# Patient Record
Sex: Female | Born: 1955 | Race: White | Hispanic: No | Marital: Married | Smoking: Never smoker
Health system: Southern US, Community
[De-identification: ages and names within clinical notes are randomized; demographics above are authoritative.]

## PROBLEM LIST (undated history)

## (undated) DIAGNOSIS — E119 Type 2 diabetes mellitus without complications: Secondary | ICD-10-CM

## (undated) DIAGNOSIS — R51 Headache: Secondary | ICD-10-CM

## (undated) DIAGNOSIS — M1990 Unspecified osteoarthritis, unspecified site: Secondary | ICD-10-CM

## (undated) DIAGNOSIS — N390 Urinary tract infection, site not specified: Secondary | ICD-10-CM

## (undated) DIAGNOSIS — K219 Gastro-esophageal reflux disease without esophagitis: Secondary | ICD-10-CM

## (undated) DIAGNOSIS — E785 Hyperlipidemia, unspecified: Secondary | ICD-10-CM

## (undated) DIAGNOSIS — Z889 Allergy status to unspecified drugs, medicaments and biological substances status: Secondary | ICD-10-CM

## (undated) DIAGNOSIS — G43909 Migraine, unspecified, not intractable, without status migrainosus: Secondary | ICD-10-CM

## (undated) DIAGNOSIS — K5792 Diverticulitis of intestine, part unspecified, without perforation or abscess without bleeding: Secondary | ICD-10-CM

## (undated) DIAGNOSIS — D7218 Eosinophilia in diseases classified elsewhere: Secondary | ICD-10-CM

## (undated) DIAGNOSIS — T7840XA Allergy, unspecified, initial encounter: Secondary | ICD-10-CM

## (undated) DIAGNOSIS — B019 Varicella without complication: Secondary | ICD-10-CM

## (undated) DIAGNOSIS — M354 Diffuse (eosinophilic) fasciitis: Secondary | ICD-10-CM

## (undated) DIAGNOSIS — E039 Hypothyroidism, unspecified: Secondary | ICD-10-CM

## (undated) DIAGNOSIS — R519 Headache, unspecified: Secondary | ICD-10-CM

## (undated) DIAGNOSIS — E079 Disorder of thyroid, unspecified: Secondary | ICD-10-CM

## (undated) HISTORY — DX: Disorder of thyroid, unspecified: E07.9

## (undated) HISTORY — DX: Headache: R51

## (undated) HISTORY — PX: BACK SURGERY: SHX140

## (undated) HISTORY — PX: TONSILLECTOMY: SUR1361

## (undated) HISTORY — DX: Type 2 diabetes mellitus without complications: E11.9

## (undated) HISTORY — DX: Headache, unspecified: R51.9

## (undated) HISTORY — PX: ABDOMINAL SURGERY: SHX537

## (undated) HISTORY — DX: Diverticulitis of intestine, part unspecified, without perforation or abscess without bleeding: K57.92

## (undated) HISTORY — DX: Varicella without complication: B01.9

## (undated) HISTORY — DX: Urinary tract infection, site not specified: N39.0

## (undated) HISTORY — DX: Hyperlipidemia, unspecified: E78.5

## (undated) HISTORY — DX: Unspecified osteoarthritis, unspecified site: M19.90

## (undated) HISTORY — DX: Migraine, unspecified, not intractable, without status migrainosus: G43.909

## (undated) HISTORY — DX: Allergy, unspecified, initial encounter: T78.40XA

## (undated) HISTORY — PX: MOUTH SURGERY: SHX715

## (undated) HISTORY — DX: Gastro-esophageal reflux disease without esophagitis: K21.9

---

## 1999-10-07 HISTORY — PX: ABDOMINAL HYSTERECTOMY: SHX81

## 2009-01-01 DIAGNOSIS — E782 Mixed hyperlipidemia: Secondary | ICD-10-CM | POA: Insufficient documentation

## 2009-01-01 DIAGNOSIS — E119 Type 2 diabetes mellitus without complications: Secondary | ICD-10-CM | POA: Insufficient documentation

## 2009-01-01 DIAGNOSIS — K219 Gastro-esophageal reflux disease without esophagitis: Secondary | ICD-10-CM | POA: Insufficient documentation

## 2009-01-01 DIAGNOSIS — K5732 Diverticulitis of large intestine without perforation or abscess without bleeding: Secondary | ICD-10-CM | POA: Insufficient documentation

## 2009-01-01 DIAGNOSIS — E039 Hypothyroidism, unspecified: Secondary | ICD-10-CM | POA: Insufficient documentation

## 2009-01-01 DIAGNOSIS — F329 Major depressive disorder, single episode, unspecified: Secondary | ICD-10-CM | POA: Insufficient documentation

## 2009-01-01 DIAGNOSIS — L500 Allergic urticaria: Secondary | ICD-10-CM | POA: Insufficient documentation

## 2015-09-12 ENCOUNTER — Encounter: Payer: Self-pay | Admitting: Nurse Practitioner

## 2015-11-10 ENCOUNTER — Emergency Department (HOSPITAL_BASED_OUTPATIENT_CLINIC_OR_DEPARTMENT_OTHER): Payer: No Typology Code available for payment source

## 2015-11-10 ENCOUNTER — Emergency Department (HOSPITAL_BASED_OUTPATIENT_CLINIC_OR_DEPARTMENT_OTHER)
Admission: EM | Admit: 2015-11-10 | Discharge: 2015-11-10 | Disposition: A | Discharge status: Home / Self Care | Payer: No Typology Code available for payment source | Attending: Physician Assistant | Admitting: Physician Assistant

## 2015-11-10 ENCOUNTER — Encounter (HOSPITAL_BASED_OUTPATIENT_CLINIC_OR_DEPARTMENT_OTHER): Payer: Self-pay | Admitting: *Deleted

## 2015-11-10 DIAGNOSIS — W228XXA Striking against or struck by other objects, initial encounter: Secondary | ICD-10-CM | POA: Diagnosis not present

## 2015-11-10 DIAGNOSIS — Z8739 Personal history of other diseases of the musculoskeletal system and connective tissue: Secondary | ICD-10-CM | POA: Insufficient documentation

## 2015-11-10 DIAGNOSIS — Y9389 Activity, other specified: Secondary | ICD-10-CM | POA: Diagnosis not present

## 2015-11-10 DIAGNOSIS — Y998 Other external cause status: Secondary | ICD-10-CM | POA: Insufficient documentation

## 2015-11-10 DIAGNOSIS — Z8639 Personal history of other endocrine, nutritional and metabolic disease: Secondary | ICD-10-CM | POA: Insufficient documentation

## 2015-11-10 DIAGNOSIS — S6992XA Unspecified injury of left wrist, hand and finger(s), initial encounter: Secondary | ICD-10-CM | POA: Diagnosis present

## 2015-11-10 DIAGNOSIS — S60222A Contusion of left hand, initial encounter: Secondary | ICD-10-CM | POA: Diagnosis not present

## 2015-11-10 DIAGNOSIS — Y9289 Other specified places as the place of occurrence of the external cause: Secondary | ICD-10-CM | POA: Diagnosis not present

## 2015-11-10 HISTORY — DX: Allergy status to unspecified drugs, medicaments and biological substances: Z88.9

## 2015-11-10 HISTORY — DX: Eosinophilia in diseases classified elsewhere: D72.18

## 2015-11-10 HISTORY — DX: Hypothyroidism, unspecified: E03.9

## 2015-11-10 HISTORY — DX: Diffuse (eosinophilic) fasciitis: M35.4

## 2015-11-10 NOTE — ED Provider Notes (Signed)
CSN: [id number]     Arrival date & time 11/10/15  1958 History   First MD Initiated Contact with Patient 11/10/15 2020     Chief Complaint  Patient presents with  . Hand Injury     (Consider location/radiation/quality/duration/timing/severity/associated sxs/prior Treatment) Patient is a 60 y.o. female presenting with hand injury. The history is provided by the patient.  Hand Injury Location:  Hand Injury: yes   Hand location:  L hand Pain details:    Quality:  Aching   Severity:  Moderate   Onset quality:  Sudden   Timing:  Constant   Progression:  Unchanged Chronicity:  New Dislocation: no   Foreign body present:  No foreign bodies Prior injury to area:  No  Jawana Reagor is a 60 y.o. female who presents to the ED wit left hand pain. She reports pulling at something and her hand slipped and hit the wooden bed frame. She denies any other injuries. She has had problems with this hand in the past and often has pain in her middle finger.   Past Medical History  Diagnosis Date  . Hypothyroid   . Multiple allergies   . Eosinophilic fasciitis    Past Surgical History  Procedure Laterality Date  . Back surgery    . Abdominal hysterectomy    . Abdominal surgery    . Mouth surgery    . Tonsillectomy     No family history on file. Social History  Substance Use Topics  . Smoking status: Never Smoker   . Smokeless tobacco: Never Used  . Alcohol Use: No   OB History    No data available     Review of Systems  Musculoskeletal: Positive for joint swelling.       Pain dorsum of left hand  all other systems negative    Allergies  Other  Home Medications   Prior to Admission medications   Not on File   BP 141/85 mmHg  Pulse 99  Temp(Src) 98.4 F (36.9 C) (Oral)  Resp 18  Ht  (1.626 m)  Wt 82.555 kg  BMI 31.22 kg/m2  SpO2 99% Physical Exam  Constitutional: She is oriented to person, place, and time. She appears well-developed and well-nourished.  HENT:    Head: Normocephalic and atraumatic.  Eyes: Conjunctivae and EOM are normal.  Neck: Neck supple.  Cardiovascular: Normal rate.   Pulmonary/Chest: Effort normal.  Musculoskeletal:       Left hand: She exhibits tenderness, bony tenderness and swelling. She exhibits normal capillary refill and no laceration. Decreased range of motion: due to pain. Normal sensation noted. Normal strength noted. She exhibits no thumb/finger opposition.       Hands: Ecchymosis and tenderness to the dorsum of the left middle finger and hand. Radial pulses 2+, adequate circulation, good touch sensation.  Neurological: She is alert and oriented to person, place, and time. No cranial nerve deficit.  Skin: Skin is warm and dry.  Psychiatric: She has a normal mood and affect. Her behavior is normal.  Nursing note and vitals reviewed.   ED Course  Procedures (including critical care time) Labs Review Labs Reviewed - No data to display  Imaging Review Dg Hand Complete Left  11/10/2015  CLINICAL DATA:  Left hand pain after injury. Struck hand on a bed frame prior to arrival. EXAM: LEFT HAND - COMPLETE 3+ VIEW COMPARISON:  None. FINDINGS: No fracture or dislocation. The alignment and joint spaces are maintained. Carpometacarpal joints are intact. No focal  soft tissue abnormality. IMPRESSION: No fracture or dislocation of the left hand. Electronically Signed   By: Rubye Oaks M.D.   On: 11/10/2015 20:25   I have personally reviewed and evaluated these images as part of my medical decision-making. Splint to right middle finger.   MDM  60 y.o. female with pain to the dorsum of the left hand s/p injury stable for d/c without focal neuro deficits. Will d/c home and she will apply ice, elevate, take OTC pain deliver and follow up with her PCP if symptoms persist.    Final diagnoses:  Contusion, hand, left, initial encounter       [facility], NP 11/10/15 2222  Courteney Lyn Mackuen, MD 11/10/15 2318

## 2015-11-10 NOTE — Discharge Instructions (Signed)
Take Advil as needed for pain. Follow up with Dr. Pearletha Forge if symptoms persist. Return here as needed.

## 2015-11-10 NOTE — ED Notes (Signed)
Pt reports she hit left hand on wooden bed frame

## 2015-11-14 ENCOUNTER — Encounter: Payer: Self-pay | Admitting: Nurse Practitioner

## 2015-11-20 DIAGNOSIS — Z6830 Body mass index (BMI) 30.0-30.9, adult: Secondary | ICD-10-CM | POA: Insufficient documentation

## 2016-01-22 DIAGNOSIS — M2550 Pain in unspecified joint: Secondary | ICD-10-CM | POA: Insufficient documentation

## 2016-06-30 ENCOUNTER — Emergency Department (HOSPITAL_BASED_OUTPATIENT_CLINIC_OR_DEPARTMENT_OTHER)
Admission: EM | Admit: 2016-06-30 | Discharge: 2016-06-30 | Disposition: A | Discharge status: Home / Self Care | Payer: No Typology Code available for payment source | Attending: Emergency Medicine | Admitting: Emergency Medicine

## 2016-06-30 ENCOUNTER — Encounter (HOSPITAL_BASED_OUTPATIENT_CLINIC_OR_DEPARTMENT_OTHER): Payer: Self-pay | Admitting: *Deleted

## 2016-06-30 DIAGNOSIS — E039 Hypothyroidism, unspecified: Secondary | ICD-10-CM | POA: Diagnosis not present

## 2016-06-30 DIAGNOSIS — Z79899 Other long term (current) drug therapy: Secondary | ICD-10-CM | POA: Diagnosis not present

## 2016-06-30 DIAGNOSIS — L03115 Cellulitis of right lower limb: Secondary | ICD-10-CM | POA: Insufficient documentation

## 2016-06-30 DIAGNOSIS — M25571 Pain in right ankle and joints of right foot: Secondary | ICD-10-CM | POA: Diagnosis present

## 2016-06-30 MED ORDER — DOXYCYCLINE HYCLATE 100 MG PO CAPS: 100.0000 mg | ORAL_CAPSULE | Freq: Two times a day (BID) | ORAL | 0 refills | Status: DC

## 2016-06-30 MED ORDER — CEPHALEXIN 250 MG PO CAPS
500.0000 mg | ORAL_CAPSULE | Freq: Once | ORAL | Status: DC
  Filled 2016-06-30: qty 2

## 2016-06-30 MED ORDER — DOXYCYCLINE HYCLATE 100 MG PO TABS
100.0000 mg | ORAL_TABLET | Freq: Once | ORAL | Status: DC
  Filled 2016-06-30: qty 1

## 2016-06-30 MED ORDER — CEPHALEXIN 500 MG PO CAPS: 500.0000 mg | ORAL_CAPSULE | Freq: Two times a day (BID) | ORAL | 0 refills | Status: DC

## 2016-06-30 NOTE — ED Triage Notes (Signed)
Pt reports right ankle pain with swelling and warmth x 6 days.

## 2016-06-30 NOTE — ED Provider Notes (Addendum)
[facility]   CSN: [id number] Arrival date & time: 06/30/16  1900 By signing my name below, I, Bridgette Habermann, attest that this documentation has been prepared under the direction and in the presence of Tomasita Crumble, MD. Electronically Signed: Bridgette Habermann, ED Scribe. 06/30/16. 9:28 PM.  History   Chief Complaint Chief Complaint  Patient presents with  . Ankle Pain   HPI Comments: Monica Dean is a 60 y.o. female with h/o tendonitis who presents to the Emergency Department complaining of  10/10 right ankle pain with swelling and warmth onset 6 days ago with associated fever (Tmax 99.7). She denies any recent injury or trauma. Pain is exacerbated with bearing weight on the area. Pt uses a boot intermittently with moderate relief to the pain. No other treatments tried. Denies h/o surgery to the area. Pt states she is suppose to get an MRI done tomorrow. Pt denies numbness and paresthesias to the area.  The history is provided by the patient. No language interpreter was used.    Past Medical History:  Diagnosis Date  . Eosinophilic fasciitis   . Hypothyroid   . Multiple allergies     There are no active problems to display for this patient.   Past Surgical History:  Procedure Laterality Date  . ABDOMINAL HYSTERECTOMY    . ABDOMINAL SURGERY    . BACK SURGERY    . MOUTH SURGERY    . TONSILLECTOMY      OB History    No data available       Home Medications    Prior to Admission medications   Medication Sig Start Date End Date Taking? Authorizing Provider  fexofenadine (ALLEGRA) 30 MG tablet Take 30 mg by mouth 2 (two) times daily.   Yes Historical Provider, MD  thyroid (ARMOUR) 130 MG tablet Take 130 mg by mouth daily.   Yes Historical Provider, MD    Family History History reviewed. No pertinent family history.  Social History Social History  Substance Use Topics  . Smoking status: Never Smoker  . Smokeless tobacco: Never Used  . Alcohol use  No     Allergies   Other   Review of Systems Review of Systems 10 Systems reviewed and all are negative for acute change except as noted in the HPI. Physical Exam Updated Vital Signs BP (!) 135/109 (BP Location: Left Arm)   Pulse 111   Temp 99.7 F (37.6 C) (Oral)   Resp 18   Ht 5\' 5"  (1.651 m)   Wt 182 lb (82.6 kg)   SpO2 97%   BMI 30.29 kg/m   Physical Exam  Constitutional: She is oriented to person, place, and time. She appears well-developed and well-nourished. No distress.  HENT:  Head: Normocephalic and atraumatic.  Nose: Nose normal.  Mouth/Throat: Oropharynx is clear and moist. No oropharyngeal exudate.  Eyes: Conjunctivae and EOM are normal. Pupils are equal, round, and reactive to light. No scleral icterus.  Neck: Normal range of motion. Neck supple. No JVD present. No tracheal deviation present. No thyromegaly present.  Cardiovascular: Normal rate, regular rhythm and normal heart sounds.  Exam reveals no gallop and no friction rub.   No murmur heard. Pulmonary/Chest: Effort normal and breath sounds normal. No respiratory distress. She has no wheezes. She exhibits no tenderness.  Abdominal: Soft. Bowel sounds are normal. She exhibits no distension and no mass. There is no tenderness. There is no rebound and no guarding.  Musculoskeletal: Normal range of motion. She exhibits  tenderness. She exhibits no edema.  Left foot erythema and warmth to the left achilles' tendon. Tenderness to palpation but normal ROM. Normal pulses and sensation.  Lymphadenopathy:    She has no cervical adenopathy.  Neurological: She is alert and oriented to person, place, and time. No cranial nerve deficit. She exhibits normal muscle tone.  Skin: Skin is warm and dry. No rash noted. No erythema. No pallor.  Nursing note and vitals reviewed.   ED Treatments / Results  DIAGNOSTIC STUDIES: Oxygen Saturation is 97% on RA, adequate by my interpretation.    COORDINATION OF CARE: 9:28 PM  Discussed treatment plan with pt at bedside which includes Rx of antibiotics and pt agreed to plan.  Labs (all labs ordered are listed, but only abnormal results are displayed) Labs Reviewed - No data to display  EKG  EKG Interpretation None       Radiology No results found.  Procedures Procedures (including critical care time)  Medications Ordered in ED Medications - No data to display   Initial Impression / Assessment and Plan / ED Course  I have reviewed the triage vital signs and the nursing notes.  Pertinent labs & imaging results that were available during my care of the patient were reviewed by me and considered in my medical decision making (see chart for details).  Clinical Course    Patient presents to the ED for rash, pain and swelling in her ankle. PE is consistent with cellulitis. She was treated with keflex and doxycyline.  Plan to treat for 5 days and fu with PCP for wound check.    Final Clinical Impressions(s) / ED Diagnoses   Final diagnoses:  None    I personally performed the services described in this documentation, which was scribed in my presence. The recorded information has been reviewed and is accurate.    New Prescriptions New Prescriptions   No medications on file     Tomasita CrumbleAdeleke Roland Prine, MD 06/30/16 2137    Tomasita CrumbleAdeleke Pepper Kerrick, MD 06/30/16 2354

## 2016-06-30 NOTE — ED Notes (Signed)
C/o achilles tendon pain w slight swelling x 6 days  Hx of same

## 2016-08-06 LAB — POCT ERYTHROCYTE SEDIMENTATION RATE, NON-AUTOMATED: SED RATE: 10

## 2017-03-18 ENCOUNTER — Encounter: Payer: Self-pay | Admitting: Nurse Practitioner

## 2018-01-07 LAB — MICROALBUMIN, URINE: Microalb, Ur: 344

## 2018-01-09 LAB — LIPID PANEL
Cholesterol: 255 — AB (ref 0–200)
HDL: 31 — AB (ref 35–70)
LDL Cholesterol: 195
Triglycerides: 201 — AB (ref 40–160)

## 2018-01-09 LAB — BASIC METABOLIC PANEL
BUN: 13 (ref 4–21)
CREATININE: 0.9 (ref 0.5–1.1)
GLUCOSE: 215
Potassium: 4.3 (ref 3.4–5.3)
Sodium: 140 (ref 137–147)

## 2018-01-09 LAB — HEMOGLOBIN A1C: HEMOGLOBIN A1C: 8.7

## 2018-01-09 LAB — TSH: TSH: 5.9 (ref 0.41–5.90)

## 2018-01-09 LAB — HEPATIC FUNCTION PANEL: ALT: 26 (ref 7–35)

## 2018-01-12 ENCOUNTER — Emergency Department (HOSPITAL_BASED_OUTPATIENT_CLINIC_OR_DEPARTMENT_OTHER)
Admission: EM | Admit: 2018-01-12 | Discharge: 2018-01-12 | Disposition: A | Discharge status: Home / Self Care | Payer: No Typology Code available for payment source | Attending: Emergency Medicine | Admitting: Emergency Medicine

## 2018-01-12 ENCOUNTER — Other Ambulatory Visit: Payer: Self-pay

## 2018-01-12 ENCOUNTER — Encounter (HOSPITAL_BASED_OUTPATIENT_CLINIC_OR_DEPARTMENT_OTHER): Payer: Self-pay | Admitting: *Deleted

## 2018-01-12 ENCOUNTER — Emergency Department (HOSPITAL_BASED_OUTPATIENT_CLINIC_OR_DEPARTMENT_OTHER): Payer: No Typology Code available for payment source

## 2018-01-12 DIAGNOSIS — W108XXA Fall (on) (from) other stairs and steps, initial encounter: Secondary | ICD-10-CM | POA: Insufficient documentation

## 2018-01-12 DIAGNOSIS — Y9389 Activity, other specified: Secondary | ICD-10-CM | POA: Diagnosis not present

## 2018-01-12 DIAGNOSIS — Y9289 Other specified places as the place of occurrence of the external cause: Secondary | ICD-10-CM | POA: Diagnosis not present

## 2018-01-12 DIAGNOSIS — S60221A Contusion of right hand, initial encounter: Secondary | ICD-10-CM | POA: Diagnosis not present

## 2018-01-12 DIAGNOSIS — Y999 Unspecified external cause status: Secondary | ICD-10-CM | POA: Diagnosis not present

## 2018-01-12 DIAGNOSIS — E039 Hypothyroidism, unspecified: Secondary | ICD-10-CM | POA: Insufficient documentation

## 2018-01-12 DIAGNOSIS — S6991XA Unspecified injury of right wrist, hand and finger(s), initial encounter: Secondary | ICD-10-CM | POA: Diagnosis present

## 2018-01-12 DIAGNOSIS — Z79899 Other long term (current) drug therapy: Secondary | ICD-10-CM | POA: Diagnosis not present

## 2018-01-12 DIAGNOSIS — M79641 Pain in right hand: Secondary | ICD-10-CM

## 2018-01-12 NOTE — Discharge Instructions (Signed)
Wear ace wrap as needed for comfort, compression, and protection of the hand. Ice and elevate hand throughout the day, using ice pack for no more than 20 minutes every hour.  Alternate between tylenol and motrin for pain relief. Follow up with your orthopedist in 1-2 weeks for recheck of symptoms and ongoing management of your hand injury. Return to the ER for changes or worsening symptoms.

## 2018-01-12 NOTE — ED Triage Notes (Signed)
2 days ago she stumbled and caught herself from a fall hitting her right hand on the floor of her RV. Since then she can't use it to pick up objects. Painful.

## 2018-01-12 NOTE — ED Notes (Signed)
Ace wrap applied by RN Marchelle FolksAmanda

## 2018-01-12 NOTE — ED Provider Notes (Signed)
[facility] EMERGENCY DEPARTMENT Provider Note   CSN: [id number] Arrival date & time: 01/12/18  1132     History   Chief Complaint Chief Complaint  Patient presents with  . Fall  . Hand Injury    HPI Monica Dean is a 62 y.o. female with a PMHx of hypothyroidism, who presents to the ED with complaints of right hand pain after a mechanical fall that occurred 2 days ago.  Patient states that she was going up the steps of her RV when she stumbled and fell on her outstretched right hand striking the step of the RV with her hand.  Since then she has had 7/10 constant throbbing right hand pain that radiates into the forearm, worse with use or movement of the hand, and somewhat improved with ibuprofen and ice.  She reports associated swelling and bruising.  She denies hitting her head, LOC, wrist or elbow pain, numbness, tingling, focal weakness, or any other complaints at this time.  No other injury sustained during the incident.  She is right-handed.  She follows with [facility].  The history is provided by the patient and medical records. No language interpreter was used.  Fall   Hand Injury   The incident occurred 2 days ago. The incident occurred at home. The injury mechanism was a fall. The pain is present in the right hand. The quality of the pain is described as throbbing. The pain is at a severity of 7/10. The pain is moderate. The pain has been constant since the incident. She reports no foreign bodies present. The symptoms are aggravated by movement and use. She has tried ice and NSAIDs for the symptoms. The treatment provided mild relief.    Past Medical History:  Diagnosis Date  . Eosinophilic fasciitis   . Hypothyroid   . Multiple allergies     There are no active problems to display for this patient.   Past Surgical History:  Procedure Laterality Date  . ABDOMINAL HYSTERECTOMY    . ABDOMINAL SURGERY    . BACK SURGERY    . MOUTH SURGERY    .  TONSILLECTOMY       OB History   None      Home Medications    Prior to Admission medications   Medication Sig Start Date End Date Taking? Authorizing Provider  thyroid (ARMOUR) 130 MG tablet Take 130 mg by mouth daily.   Yes [provider]  cephALEXin (KEFLEX) 500 MG capsule Take 1 capsule (500 mg total) by mouth 2 (two) times daily. 06/30/16   Tomasita Crumble, MD  doxycycline (VIBRAMYCIN) 100 MG capsule Take 1 capsule (100 mg total) by mouth 2 (two) times daily. One po bid x 7 days 06/30/16   Tomasita Crumble, MD  fexofenadine (ALLEGRA) 30 MG tablet Take 30 mg by mouth 2 (two) times daily.    [provider]    Family History No family history on file.  Social History Social History   Tobacco Use  . Smoking status: Never Smoker  . Smokeless tobacco: Never Used  Substance Use Topics  . Alcohol use: No  . Drug use: No     Allergies   Other   Review of Systems Review of Systems  HENT: Negative for facial swelling (no head inj).   Musculoskeletal: Positive for arthralgias and joint swelling.  Skin: Positive for color change.  Allergic/Immunologic: Negative for immunocompromised state.  Neurological: Negative for syncope, weakness and numbness.  Psychiatric/Behavioral: Negative for confusion.  Physical Exam Updated Vital Signs BP (!) 142/82   Pulse 96   Temp 98.2 F (36.8 C) (Oral)   Resp 20   Ht 5\' 4"  (1.626 m)   Wt 81.6 kg (180 lb)   SpO2 98%   BMI 30.90 kg/m   Physical Exam  Constitutional: She is oriented to person, place, and time. Vital signs are normal. She appears well-developed and well-nourished.  Non-toxic appearance. No distress.  Afebrile, nontoxic, NAD  HENT:  Head: Normocephalic and atraumatic.  Mouth/Throat: Mucous membranes are normal.  Eyes: Conjunctivae and EOM are normal. Right eye exhibits no discharge. Left eye exhibits no discharge.  Neck: Normal range of motion. Neck supple.  Cardiovascular: Normal rate and intact  distal pulses.  Pulmonary/Chest: Effort normal. No respiratory distress.  Abdominal: Normal appearance. She exhibits no distension.  Musculoskeletal: Normal range of motion.       Right hand: She exhibits tenderness and bony tenderness. She exhibits normal range of motion, normal capillary refill, no deformity, no laceration and no swelling. Normal sensation noted. Normal strength noted.  R hand with FROM intact in all digits, and FROM intact at wrist/elbow, mild diffuse TTP across 2nd-5th mid-metacarpal regions, no TTP to wrist/forearm/elbow, no crepitus/deformity, minimal bruising near 2nd metacarpal region, no swelling, no wounds, Strength and sensation grossly intact, distal pulses intact, compartments soft.   Neurological: She is alert and oriented to person, place, and time. She has normal strength. No sensory deficit.  Skin: Skin is warm, dry and intact. No rash noted.  Psychiatric: She has a normal mood and affect. Her behavior is normal.  Nursing note and vitals reviewed.    ED Treatments / Results  Labs (all labs ordered are listed, but only abnormal results are displayed) Labs Reviewed - No data to display  EKG None  Radiology Dg Hand Complete Right  Result Date: 01/12/2018 CLINICAL DATA:  Fall, hand pain EXAM: RIGHT HAND - COMPLETE 3+ VIEW COMPARISON:  None. FINDINGS: No acute bony abnormality. Specifically, no fracture, subluxation, or dislocation. Joint spaces maintained. IMPRESSION: No acute bony abnormality. Electronically Signed   By: Charlett Nose M.D.   On: 01/12/2018 12:07    Procedures Procedures (including critical care time)  Medications Ordered in ED Medications - No data to display   Initial Impression / Assessment and Plan / ED Course  I have reviewed the triage vital signs and the nursing notes.  Pertinent labs & imaging results that were available during my care of the patient were reviewed by me and considered in my medical decision making (see chart  for details).     62 y.o. female here with R hand pain after mechanical fall 2 days ago. On exam, FROM intact in wrist and all fingers, mild TTP diffusely across the mid-metacarpal region of the 2nd-5th metacarpals, minimal bruising around the 2nd metacarpal area, no swelling, no crepitus or deformity. No tenderness to wrist/forearm/elbow. R hand xray negative for fx. Likely just contusion. Will apply ace wrap for comfort/compression/protection, advised RICE/tylenol/motrin for pain, and f/up with her orthopedist in 1-2wks for recheck. I explained the diagnosis and have given explicit precautions to return to the ER including for any other new or worsening symptoms. The patient understands and accepts the medical plan as it's been dictated and I have answered their questions. Discharge instructions concerning home care and prescriptions have been given. The patient is STABLE and is discharged to home in good condition.    Final Clinical Impressions(s) / ED Diagnoses   Final  diagnoses:  Right hand pain  Contusion of right hand, initial encounter    ED Discharge Orders    [address]None       Kamden Reber, [place], [place] 01/12/18 1257    Charlynne PanderYao, David Hsienta, MD 01/13/18 0700

## 2018-04-05 ENCOUNTER — Encounter: Payer: Self-pay | Admitting: Nurse Practitioner

## 2018-04-05 ENCOUNTER — Ambulatory Visit (INDEPENDENT_AMBULATORY_CARE_PROVIDER_SITE_OTHER): Payer: No Typology Code available for payment source | Admitting: Nurse Practitioner

## 2018-04-05 VITALS — BP 130/80 | HR 82 | Temp 98.7°F | Ht 64.5 in | Wt 177.0 lb

## 2018-04-05 DIAGNOSIS — L659 Nonscarring hair loss, unspecified: Secondary | ICD-10-CM

## 2018-04-05 DIAGNOSIS — R42 Dizziness and giddiness: Secondary | ICD-10-CM | POA: Diagnosis not present

## 2018-04-05 DIAGNOSIS — E039 Hypothyroidism, unspecified: Secondary | ICD-10-CM

## 2018-04-05 DIAGNOSIS — R030 Elevated blood-pressure reading, without diagnosis of hypertension: Secondary | ICD-10-CM

## 2018-04-05 NOTE — Patient Instructions (Addendum)
Labs ordered to be processed by [facility].  Entered referral to dermatology.  Uncontrolled hypothyroidism. [place]. Pending vitamin D. CBC, CMP and iron panel are normal. Ordered head CT to further evaluate dizziness  Repeat BP in office improved. Check BP at home once a day record. Bring BP reading to next OV.

## 2018-04-05 NOTE — Progress Notes (Signed)
Subjective:  Patient ID: Monica JohnsKathy Lank, female    DOB: 08/01/1956  Age: 62 y.o. MRN: [id number]  CC: Establish Care (est care/hair loss--1 mo /dizziness--2 days--tip of tongue and lip went numb last night--vomit)  Moved from [place] to [place] ago. Has pcp in [place] who she will like to maintain for now. He manages thyroid dysfunction  Dizziness  This is a new problem. The current episode started in the past 7 days. The problem occurs intermittently. The problem has been waxing and waning. Associated symptoms include nausea, numbness, vertigo and vomiting. Pertinent negatives include no chest pain, congestion, coughing, diaphoresis, fatigue, fever, headaches, neck pain, rash, sore throat, swollen glands, urinary symptoms, visual change or weakness. Nothing aggravates the symptoms. She has tried nothing for the symptoms.   Also reports hx of waxing and waning BP with provider in [place]. Does not check BP at home.  Also reports Hair loss since 01/2018, worsening over the last 1week. She brings along a small ziplock bag with hair. Uses hair dye every 6-8wks.  reports increased stress and anxiety with family life.   hx of esinophilic fasciitis, sjorgrens syndrome per patient  Outpatient Medications Prior to Visit  Medication Sig Dispense Refill  . ARMOUR THYROID 15 MG tablet Take 30 mg by mouth every morning.  1  . Cholecalciferol (VITAMIN D3) 5000 units TABS Take by mouth.    . Cyanocobalamin (B-12 PO) Take by mouth.    . fexofenadine (ALLEGRA) 180 MG tablet Take 180 tablets by mouth daily. 1/2 daily for itchy    . FREESTYLE LITE test strip CHECK BLOOD SUGAR ONCE DAILY  3  . OVER THE COUNTER MEDICATION Essential Oil.    Marland Kitchen. OVER THE COUNTER MEDICATION Tumeric    . cephALEXin (KEFLEX) 500 MG capsule Take 1 capsule (500 mg total) by mouth 2 (two) times daily. (Patient not taking: Reported on 04/05/2018) 10 capsule 0  . doxycycline (VIBRAMYCIN) 100 MG capsule Take 1 capsule (100 mg total) by mouth 2 (two)  times daily. One po bid x 7 days (Patient not taking: Reported on 04/05/2018) 10 capsule 0  . fexofenadine (ALLEGRA) 30 MG tablet Take 30 mg by mouth 2 (two) times daily.    Marland Kitchen. thyroid (ARMOUR) 130 MG tablet Take 130 mg by mouth daily.     No facility-administered medications prior to visit.    Family History  Problem Relation Age of Onset  . Alcohol abuse Mother   . Alcohol abuse Father    Social History   Socioeconomic History  . Marital status: Married    Spouse name: Not on file  . Number of children: Not on file  . Years of education: Not on file  . Highest education level: Not on file  Occupational History  . Not on file  Social Needs  . Financial resource strain: Not on file  . Food insecurity:    Worry: Not on file    Inability: Not on file  . Transportation needs:    Medical: Not on file    Non-medical: Not on file  Tobacco Use  . Smoking status: Never Smoker  . Smokeless tobacco: Never Used  Substance and Sexual Activity  . Alcohol use: No  . Drug use: No  . Sexual activity: Not on file  Lifestyle  . Physical activity:    Days per week: Not on file    Minutes per session: Not on file  . Stress: Not on file  Relationships  . Social connections:  Talks on phone: Not on file    Gets together: Not on file    Attends religious service: Not on file    Active member of club or organization: Not on file    Attends meetings of clubs or organizations: Not on file    Relationship status: Not on file  . Intimate partner violence:    Fear of current or ex partner: Not on file    Emotionally abused: Not on file    Physically abused: Not on file    Forced sexual activity: Not on file  Other Topics Concern  . Not on file  Social History Narrative  . Not on file   ROS Review of Systems  Constitutional: Negative for diaphoresis, fatigue and fever.  HENT: Negative for congestion and sore throat.   Respiratory: Negative for cough and shortness of breath.     Cardiovascular: Negative for chest pain, palpitations and leg swelling.  Gastrointestinal: Positive for nausea and vomiting. Negative for constipation and diarrhea.  Genitourinary: Negative.   Musculoskeletal: Negative for falls and neck pain.  Skin: Negative for rash.  Neurological: Positive for dizziness, vertigo and numbness. Negative for speech change, focal weakness, seizures, weakness and headaches.  Endo/Heme/Allergies: Positive for environmental allergies. Negative for polydipsia. Does not bruise/bleed easily.  Psychiatric/Behavioral: Negative for suicidal ideas. The patient is nervous/anxious.      Objective:  BP 130/80 (BP Location: Left Arm)   Pulse 82   Temp 98.7 F (37.1 C) (Oral)   Ht 5' 4.5" (1.638 m)   Wt 177 lb (80.3 kg)   SpO2 98%   BMI 29.91 kg/m   BP Readings from Last 3 Encounters:  04/05/18 130/80  01/12/18 (!) 142/82  06/30/16 155/92    Wt Readings from Last 3 Encounters:  04/05/18 177 lb (80.3 kg)  01/12/18 180 lb (81.6 kg)  06/30/16 182 lb (82.6 kg)    Physical Exam  Constitutional: She is oriented to person, place, and time. No distress.  Eyes: Conjunctivae and EOM are normal.  Neck: Normal range of motion. Neck supple. No thyromegaly present.  Cardiovascular: Normal rate and regular rhythm.  Pulmonary/Chest: Effort normal and breath sounds normal.  Musculoskeletal: She exhibits no edema.  Lymphadenopathy:    She has no cervical adenopathy.  Neurological: She is alert and oriented to person, place, and time. No cranial nerve deficit. Coordination normal.  Skin: Skin is warm and dry.  Psychiatric: Thought content normal. Her mood appears anxious. Her affect is labile. Her speech is rapid and/or pressured. She is hyperactive.  Vitals reviewed.   Lab Results  Component Value Date   WBC 7.9 04/05/2018   HGB 14.7 04/05/2018   HCT 43.4 04/05/2018   PLT 227 04/05/2018   GLUCOSE 97 04/05/2018   ALT 20 04/05/2018   AST 17 04/05/2018   NA  141 04/05/2018   K 4.1 04/05/2018   CL 104 04/05/2018   CREATININE 0.96 04/05/2018   BUN 14 04/05/2018   CO2 24 04/05/2018   TSH 8.10 (H) 04/05/2018    Dg Hand Complete Right  Result Date: 01/12/2018 CLINICAL DATA:  Fall, hand pain EXAM: RIGHT HAND - COMPLETE 3+ VIEW COMPARISON:  None. FINDINGS: No acute bony abnormality. Specifically, no fracture, subluxation, or dislocation. Joint spaces maintained. IMPRESSION: No acute bony abnormality. Electronically Signed   By: Charlett Nose M.D.   On: 01/12/2018 12:07    Assessment & Plan:   Ericka was seen today for establish care.  Diagnoses and all orders for  this visit:  Hair loss -     TSH -     Vitamin D 1,25 dihydroxy -     Iron, TIBC and Ferritin Panel -     Ambulatory referral to Dermatology  Elevated BP without diagnosis of hypertension -     CT Head Wo Contrast; Future  Dizziness -     CBC -     Comprehensive metabolic panel -     Iron, TIBC and Ferritin Panel -     CT Head Wo Contrast; Future  Hypothyroidism, unspecified type -     T4, free -     T3, free  Other orders -     SPECIMEN COMPROMISED   I am having Olegario Messier Tidwell maintain her fexofenadine, thyroid, cephALEXin, doxycycline, ARMOUR THYROID, fexofenadine, Vitamin D3, FREESTYLE LITE, OVER THE COUNTER MEDICATION, OVER THE COUNTER MEDICATION, and Cyanocobalamin (B-12 PO).  No orders of the defined types were placed in this encounter.   Follow-up: Return in about 1 week (around 04/12/2018) for dizziness and hair loss.  Alysia Penna, NP

## 2018-04-06 ENCOUNTER — Encounter: Payer: Self-pay | Admitting: Nurse Practitioner

## 2018-04-07 LAB — COMPREHENSIVE METABOLIC PANEL
AG RATIO: 1.5 (calc) (ref 1.0–2.5)
ALT: 20 U/L (ref 6–29)
AST: 17 U/L (ref 10–35)
Albumin: 4.6 g/dL (ref 3.6–5.1)
Alkaline phosphatase (APISO): 65 U/L (ref 33–130)
BUN: 14 mg/dL (ref 7–25)
CHLORIDE: 104 mmol/L (ref 98–110)
CO2: 24 mmol/L (ref 20–32)
CREATININE: 0.96 mg/dL (ref 0.50–0.99)
Calcium: 9.7 mg/dL (ref 8.6–10.4)
GLOBULIN: 3.1 g/dL (ref 1.9–3.7)
Glucose, Bld: 97 mg/dL (ref 65–99)
Potassium: 4.1 mmol/L (ref 3.5–5.3)
SODIUM: 141 mmol/L (ref 135–146)
TOTAL PROTEIN: 7.7 g/dL (ref 6.1–8.1)
Total Bilirubin: 0.4 mg/dL (ref 0.2–1.2)

## 2018-04-07 LAB — CBC
HEMATOCRIT: 43.4 % (ref 35.0–45.0)
HEMOGLOBIN: 14.7 g/dL (ref 11.7–15.5)
MCH: 28.9 pg (ref 27.0–33.0)
MCHC: 33.9 g/dL (ref 32.0–36.0)
MCV: 85.4 fL (ref 80.0–100.0)
MPV: 11.7 fL (ref 7.5–12.5)
Platelets: 227 10*3/uL (ref 140–400)
RBC: 5.08 10*6/uL (ref 3.80–5.10)
RDW: 13 % (ref 11.0–15.0)
WBC: 7.9 10*3/uL (ref 3.8–10.8)

## 2018-04-07 LAB — T3, FREE: T3 FREE: 3.2 pg/mL (ref 2.3–4.2)

## 2018-04-07 LAB — IRON,TIBC AND FERRITIN PANEL
%SAT: 15 % — AB (ref 16–45)
FERRITIN: 40 ng/mL (ref 16–288)
Iron: 66 ug/dL (ref 45–160)
TIBC: 427 ug/dL (ref 250–450)

## 2018-04-07 LAB — T4, FREE: Free T4: 0.9 ng/dL (ref 0.8–1.8)

## 2018-04-07 LAB — VITAMIN D 1,25 DIHYDROXY
VITAMIN D 1, 25 (OH) TOTAL: 53 pg/mL (ref 18–72)
Vitamin D2 1, 25 (OH)2: <8 pg/mL
Vitamin D3 1, 25 (OH)2: 53 pg/mL

## 2018-04-07 LAB — SPECIMEN COMPROMISED

## 2018-04-07 LAB — TSH: TSH: 8.1 mIU/L — ABNORMAL HIGH (ref 0.40–4.50)

## 2018-04-13 LAB — HM MAMMOGRAPHY

## 2018-04-14 ENCOUNTER — Encounter: Payer: Self-pay | Admitting: Nurse Practitioner

## 2018-04-14 ENCOUNTER — Ambulatory Visit: Payer: No Typology Code available for payment source | Admitting: Nurse Practitioner

## 2018-04-14 VITALS — BP 120/84 | HR 80 | Temp 98.6°F | Ht 64.5 in | Wt 180.0 lb

## 2018-04-14 DIAGNOSIS — Z90710 Acquired absence of both cervix and uterus: Secondary | ICD-10-CM | POA: Insufficient documentation

## 2018-04-14 DIAGNOSIS — L659 Nonscarring hair loss, unspecified: Secondary | ICD-10-CM | POA: Diagnosis not present

## 2018-04-14 DIAGNOSIS — Z1211 Encounter for screening for malignant neoplasm of colon: Secondary | ICD-10-CM | POA: Diagnosis not present

## 2018-04-14 DIAGNOSIS — R42 Dizziness and giddiness: Secondary | ICD-10-CM | POA: Diagnosis not present

## 2018-04-14 DIAGNOSIS — E559 Vitamin D deficiency, unspecified: Secondary | ICD-10-CM | POA: Insufficient documentation

## 2018-04-14 DIAGNOSIS — R569 Unspecified convulsions: Secondary | ICD-10-CM | POA: Insufficient documentation

## 2018-04-14 DIAGNOSIS — E039 Hypothyroidism, unspecified: Secondary | ICD-10-CM

## 2018-04-14 NOTE — Progress Notes (Signed)
Abstracted result and sent to scan  

## 2018-04-14 NOTE — Progress Notes (Signed)
Subjective:  Patient ID: Monica JohnsKathy Miklas, female    DOB: 04/23/1956  Age: 62 y.o. MRN: [id number]  CC: Follow-up (still has some dizziness, still lossing hair, [facility]SH [facility] and [facility] and pt does not think it is right. FYI had MM done 04/13/18,denied Tdap)  HPI   Dizziness: Acute on chronic.  Improved since last OV No acute finding on CT head. Denies any new symptoms.  Hair loss:  Decreased shedding. Reports new hair growth with use of moisturizing oils. States she thinks it is due to hair dye. Has appt with dermatology 07/2018.  Hypothyroidism: Uncontrolled with armour thyroid. She wants thyroid function repeat due to report of compromised specimen per [facility]. Has not made appt with pcp in [place].  Reviewed [facility] today.  Outpatient Medications Prior to Visit  Medication Sig Dispense Refill  . ARMOUR THYROID 15 MG tablet Take 30 mg by mouth every morning.  1  . Cholecalciferol (VITAMIN D3) 5000 units TABS Take by mouth.    . Cyanocobalamin (B-12 PO) Take by mouth.    . fexofenadine (ALLEGRA) 180 MG tablet Take 180 tablets by mouth daily. 1/2 daily for itchy    . fexofenadine (ALLEGRA) 30 MG tablet Take 30 mg by mouth 2 (two) times daily.    Marland Kitchen. FREESTYLE LITE test strip CHECK BLOOD SUGAR ONCE DAILY  3  . OVER THE COUNTER MEDICATION Essential Oil.    Marland Kitchen. OVER THE COUNTER MEDICATION Tumeric    . thyroid (ARMOUR) 130 MG tablet Take 130 mg by mouth daily.    . cephALEXin (KEFLEX) 500 MG capsule Take 1 capsule (500 mg total) by mouth 2 (two) times daily. (Patient not taking: Reported on 04/05/2018) 10 capsule 0  . doxycycline (VIBRAMYCIN) 100 MG capsule Take 1 capsule (100 mg total) by mouth 2 (two) times daily. One po bid x 7 days (Patient not taking: Reported on 04/05/2018) 10 capsule 0   No facility-administered medications prior to visit.     ROS See HPI  Objective:  BP 120/84   Pulse 80   Temp 98.6 F (37 C) (Oral)   Ht 5' 4.5" (1.638 m)    Wt 180 lb (81.6 kg)   SpO2 98%   BMI 30.42 kg/m   BP Readings from Last 3 Encounters:  04/14/18 120/84  04/05/18 130/80  01/12/18 (!) 142/82    Wt Readings from Last 3 Encounters:  04/14/18 180 lb (81.6 kg)  04/05/18 177 lb (80.3 kg)  01/12/18 180 lb (81.6 kg)    Physical Exam  Constitutional: She is oriented to person, place, and time. No distress.  Eyes: Pupils are equal, round, and reactive to light. Conjunctivae and EOM are normal.  Neck: Normal range of motion. Neck supple.  Cardiovascular: Normal rate.  Pulmonary/Chest: Effort normal.  Neurological: She is alert and oriented to person, place, and time. No cranial nerve deficit. Coordination normal.  Skin: Skin is warm and dry. No rash noted.  Psychiatric: Thought content normal. Her mood appears anxious. Her speech is rapid and/or pressured. Cognition and memory are normal.  Vitals reviewed.  Lab Results  Component Value Date   WBC 7.9 04/05/2018   HGB 14.7 04/05/2018   HCT 43.4 04/05/2018   PLT 227 04/05/2018   GLUCOSE 97 04/05/2018   ALT 20 04/05/2018   AST 17 04/05/2018   NA 141 04/05/2018   K 4.1 04/05/2018   CL 104 04/05/2018   CREATININE 0.96 04/05/2018  BUN 14 04/05/2018   CO2 24 04/05/2018   TSH 6.23 (H) 04/14/2018    Dg Hand Complete Right  Result Date: 01/12/2018 CLINICAL DATA:  Fall, hand pain EXAM: RIGHT HAND - COMPLETE 3+ VIEW COMPARISON:  None. FINDINGS: No acute bony abnormality. Specifically, no fracture, subluxation, or dislocation. Joint spaces maintained. IMPRESSION: No acute bony abnormality. Electronically Signed   By: Charlett Nose M.D.   On: 01/12/2018 12:07    Assessment & Plan:   Insiya was seen today for follow-up.  Diagnoses and all orders for this visit:  Hypothyroidism, unspecified type -     TSH -     T3, free -     T4, free  Colon cancer screening -     Cologuard; Future  Hair loss  Dizziness and giddiness   I am having Olegario Messier Neer maintain her fexofenadine,  thyroid, cephALEXin, doxycycline, ARMOUR THYROID, fexofenadine, Vitamin D3, FREESTYLE LITE, OVER THE COUNTER MEDICATION, OVER THE COUNTER MEDICATION, and Cyanocobalamin (B-12 PO).  No orders of the defined types were placed in this encounter.   Follow-up: Return if symptoms worsen or fail to improve.  Alysia Penna, NP

## 2018-04-14 NOTE — Patient Instructions (Addendum)
You will be contact about cologuard kit.  Persistent elevation in TSH. Advised to contact provider in [place] who is managing hypothyroidism.

## 2018-04-15 ENCOUNTER — Encounter: Payer: Self-pay | Admitting: Nurse Practitioner

## 2018-04-15 DIAGNOSIS — M3500 Sicca syndrome, unspecified: Secondary | ICD-10-CM | POA: Insufficient documentation

## 2018-04-15 DIAGNOSIS — L659 Nonscarring hair loss, unspecified: Secondary | ICD-10-CM | POA: Insufficient documentation

## 2018-04-15 LAB — JO-1 ANTIBODY-IGG: JO-1 ANTIBODY, IGG: NEGATIVE

## 2018-04-15 LAB — C4 COMPLEMENT: COMPLEMENT C4, SERUM: 34

## 2018-04-15 LAB — COMPLEMENT, TOTAL (CH50)

## 2018-04-15 LAB — ANTI-NUCLEAR AB-TITER (ANA TITER): ANA Ab, IFA: POSITIVE

## 2018-04-15 LAB — URIC ACID: Uric Acid: 4

## 2018-04-15 LAB — SMITH/RNP ANTIBODIES
SMITH ANTIBODIES: NEGATIVE
SMITH/RNP ANTIBODIES: NEGATIVE

## 2018-04-15 LAB — T3, FREE: T3, Free: 3.8 pg/mL (ref 2.3–4.2)

## 2018-04-15 LAB — TSH: TSH: 6.23 m[IU]/L — AB (ref 0.40–4.50)

## 2018-04-15 LAB — C3 COMPLEMENT: Complement C3, Serum: 163

## 2018-04-15 LAB — C-REACTIVE PROTEIN: CRP: 11.6

## 2018-04-15 LAB — ANA REFLEX TITER, PATTERN, CASCADE

## 2018-04-15 LAB — ANA

## 2018-04-15 LAB — RHEUMATOID FACTOR: Rheumatoid Factor (IgA): <14

## 2018-04-15 LAB — ANTI-SCLERODERMA ANTIBODY: SCLERODERMA (SCL-70) (ENA) ANTIBODY, IGG: NEGATIVE

## 2018-04-15 LAB — T4, FREE: FREE T4: 0.9 ng/dL (ref 0.8–1.8)

## 2018-04-15 LAB — ANTI-DSDNA ANTIBODIES

## 2018-04-15 LAB — HLA-B27 ANTIGEN: HLA B27: NEGATIVE

## 2018-04-15 LAB — SJOGREN'S SYNDROME ANTIBODS(SSA + SSB)
SJOGREN'S ANTI-SS-A: POSITIVE
Sjogren's Anti-SS-B: <1

## 2018-04-15 NOTE — Assessment & Plan Note (Signed)
Chronic per patient. Waxing and waning. Improving in the last 1week. No acute finding on head CT.

## 2018-04-15 NOTE — Assessment & Plan Note (Signed)
Not consistent with armour thyroid. Started with 1tab per day, now taking 2tabs per day. She wants to maintain management with provider in [place].

## 2018-04-15 NOTE — Assessment & Plan Note (Signed)
Labs done 08/14/2016 by [facility] SS-A 3.4 ANA positive ANA titer 1:80 SCL-70 negative SmRNP IgG negative Jo-1 antibody IgG negative HLA B27 negative Complement total, CH50 >60 Anti-dsDNA antibodies negative Rheumatoid factor negative CRP 11.6 Complement C4 34 Complement C3 163 Uric acid 4.0  

## 2018-04-15 NOTE — Progress Notes (Signed)
Abstracted result and sent to scan  

## 2018-04-15 NOTE — Assessment & Plan Note (Signed)
She is attributing to use of hair dye. Did not make appt with dermatology.

## 2018-04-15 NOTE — Assessment & Plan Note (Signed)
HgbA1c 8.7 [facility] declined medication. Positive urine microalbumin (done 01/09/2018)

## 2018-04-20 ENCOUNTER — Other Ambulatory Visit: Payer: Self-pay

## 2018-04-20 ENCOUNTER — Emergency Department (HOSPITAL_BASED_OUTPATIENT_CLINIC_OR_DEPARTMENT_OTHER): Payer: No Typology Code available for payment source

## 2018-04-20 ENCOUNTER — Emergency Department (HOSPITAL_BASED_OUTPATIENT_CLINIC_OR_DEPARTMENT_OTHER)
Admission: EM | Admit: 2018-04-20 | Discharge: 2018-04-20 | Disposition: A | Discharge status: Home / Self Care | Payer: No Typology Code available for payment source | Attending: Emergency Medicine | Admitting: Emergency Medicine

## 2018-04-20 ENCOUNTER — Encounter (HOSPITAL_BASED_OUTPATIENT_CLINIC_OR_DEPARTMENT_OTHER): Payer: Self-pay

## 2018-04-20 DIAGNOSIS — Y999 Unspecified external cause status: Secondary | ICD-10-CM | POA: Diagnosis not present

## 2018-04-20 DIAGNOSIS — S93401A Sprain of unspecified ligament of right ankle, initial encounter: Secondary | ICD-10-CM | POA: Insufficient documentation

## 2018-04-20 DIAGNOSIS — E039 Hypothyroidism, unspecified: Secondary | ICD-10-CM | POA: Insufficient documentation

## 2018-04-20 DIAGNOSIS — W1831XA Fall on same level due to stepping on an object, initial encounter: Secondary | ICD-10-CM | POA: Insufficient documentation

## 2018-04-20 DIAGNOSIS — E119 Type 2 diabetes mellitus without complications: Secondary | ICD-10-CM | POA: Diagnosis not present

## 2018-04-20 DIAGNOSIS — Y92007 Garden or yard of unspecified non-institutional (private) residence as the place of occurrence of the external cause: Secondary | ICD-10-CM | POA: Diagnosis not present

## 2018-04-20 DIAGNOSIS — Z79899 Other long term (current) drug therapy: Secondary | ICD-10-CM | POA: Insufficient documentation

## 2018-04-20 DIAGNOSIS — Y9301 Activity, walking, marching and hiking: Secondary | ICD-10-CM | POA: Insufficient documentation

## 2018-04-20 DIAGNOSIS — S99911A Unspecified injury of right ankle, initial encounter: Secondary | ICD-10-CM | POA: Diagnosis present

## 2018-04-20 DIAGNOSIS — X501XXA Overexertion from prolonged static or awkward postures, initial encounter: Secondary | ICD-10-CM | POA: Diagnosis not present

## 2018-04-20 NOTE — Discharge Instructions (Signed)

## 2018-04-20 NOTE — ED Triage Notes (Signed)
Pt states fell last night, c/o rt ankle pain

## 2018-04-20 NOTE — ED Provider Notes (Signed)
[facility] EMERGENCY DEPARTMENT Provider Note   CSN: [id number] Arrival date & time: 04/20/18  1000     History   Chief Complaint Chief Complaint  Patient presents with  . Ankle Pain    HPI Monica Dean is a 62 y.o. female.  HPI   62 year old female with a history of T2DM, diverticulosis, GERD, hyperlipidemia, hypothyroid who presents emergency department today for evaluation of right ankle pain after a fall that occurred yesterday.  Patient reports that fall was mechanical and she was walking through her yard when she stepped into a hole and twisted her ankle.  States that she fell onto the ground.  Denies any head trauma or LOC.  No neck or back pain.  No other injuries besides her right ankle and foot.  Pain was sudden in onset.  Has been severe in nature.  Reports pain is worse with ambulation.  Denies any other exacerbating or alleviating factors.  Reports mild relief after icing it last night.  Past Medical History:  Diagnosis Date  . Allergy   . Arthritis   . Chicken pox   . Diabetes mellitus without complication (HCC)   . Diverticulitis   . Eosinophilic fasciitis   . Frequent headaches   . GERD (gastroesophageal reflux disease)   . Hyperlipidemia   . Hypothyroid   . Migraines   . Multiple allergies   . UTI (urinary tract infection)     Patient Active Problem List   Diagnosis Date Noted  . Sjogren's syndrome (HCC) 04/15/2018  . Hair loss 04/15/2018  . Convulsions (HCC) 04/14/2018  . Dizziness and giddiness 04/14/2018  . S/P total hysterectomy 04/14/2018  . Vitamin D deficiency 04/14/2018  . Pain in joints 01/22/2016  . Body mass index (bmi) 30.0-30.9, adult 11/20/2015  . Allergic urticaria 01/01/2009  . Diverticulitis of colon 01/01/2009  . Esophageal reflux 01/01/2009  . Hypothyroidism 01/01/2009  . Mixed hyperlipidemia 01/01/2009  . Other depressive disorder 01/01/2009  . DM (diabetes mellitus) (HCC) 01/01/2009    Past Surgical History:    Procedure Laterality Date  . ABDOMINAL HYSTERECTOMY  2001  . ABDOMINAL SURGERY    . BACK SURGERY    . MOUTH SURGERY    . TONSILLECTOMY       OB History   None      Home Medications    Prior to Admission medications   Medication Sig Start Date End Date Taking? Authorizing Provider  ARMOUR THYROID 15 MG tablet Take 30 mg by mouth every morning. 03/05/18   [provider]  cephALEXin (KEFLEX) 500 MG capsule Take 1 capsule (500 mg total) by mouth 2 (two) times daily. Patient not taking: Reported on 04/05/2018 06/30/16   Tomasita Crumble, MD  Cholecalciferol (VITAMIN D3) 5000 units TABS Take by mouth.    [provider]  Cyanocobalamin (B-12 PO) Take by mouth.    [provider]  doxycycline (VIBRAMYCIN) 100 MG capsule Take 1 capsule (100 mg total) by mouth 2 (two) times daily. One po bid x 7 days Patient not taking: Reported on 04/05/2018 06/30/16   Tomasita Crumble, MD  fexofenadine (ALLEGRA) 180 MG tablet Take 180 tablets by mouth daily. 1/2 daily for itchy    [provider]  fexofenadine (ALLEGRA) 30 MG tablet Take 30 mg by mouth 2 (two) times daily.    [provider]  FREESTYLE LITE test strip CHECK BLOOD SUGAR ONCE DAILY 01/18/18   [provider]  OVER THE COUNTER MEDICATION Essential Oil.  [provider]  OVER THE COUNTER MEDICATION Tumeric    [provider]  thyroid (ARMOUR) 130 MG tablet Take 130 mg by mouth daily.    [provider]    Family History Family History  Problem Relation Age of Onset  . Alcohol abuse Mother   . Alcohol abuse Father     Social History Social History   Tobacco Use  . Smoking status: Never Smoker  . Smokeless tobacco: Never Used  Substance Use Topics  . Alcohol use: No  . Drug use: No     Allergies   Codeine; Diazepam; Fluoxetine; Morphine; and Other   Review of Systems Review of Systems  Constitutional: Negative for fever.  Musculoskeletal:       Ankle  pain, foot pain  Skin: Negative for wound.  Neurological: Negative for weakness and numbness.     Physical Exam Updated Vital Signs BP 123/82 (BP Location: Right Arm)   Pulse 80   Temp 98.6 F (37 C) (Oral)   Resp 18   Ht 5\' 4"  (1.626 m)   Wt 79.8 kg (176 lb)   SpO2 98%   BMI 30.21 kg/m   Physical Exam  Constitutional: She appears well-developed and well-nourished. No distress.  HENT:  Head: Normocephalic and atraumatic.  Eyes: Conjunctivae are normal.  Neck: Neck supple.  Cardiovascular: Normal rate.  Pulmonary/Chest: Effort normal.  Musculoskeletal:  TTP to the medial and lateral malleolus.  Also has tenderness to the Achilles tendon however it is intact.  Tenderness to the hindfoot on the lateral aspect of the foot.  Sensation intact.  Distal pulses intact.  Dorsiflexion and plantarflexion is intact however mildly decreased due to pain.  No tenderness to the tibia/fibula.  Neurological: She is alert.  Skin: Skin is warm and dry. Capillary refill takes less than 2 seconds.  Psychiatric: She has a normal mood and affect.  Nursing note and vitals reviewed.   ED Treatments / Results  Labs (all labs ordered are listed, but only abnormal results are displayed) Labs Reviewed - No data to display  EKG None  Radiology Dg Ankle Complete Right  Result Date: 04/20/2018 CLINICAL DATA:  Fall.  Foot and ankle pain EXAM: RIGHT ANKLE - COMPLETE 3+ VIEW COMPARISON:  None. FINDINGS: No acute bony abnormality. Specifically, no fracture, subluxation, or dislocation. Plantar calcaneal spur. IMPRESSION: No acute bony abnormality. Electronically Signed   By: Charlett Nose M.D.   On: 04/20/2018 10:36   Dg Foot Complete Right  Result Date: 04/20/2018 CLINICAL DATA:  Fall.  Foot and ankle pain EXAM: RIGHT FOOT COMPLETE - 3+ VIEW COMPARISON:  None. FINDINGS: Small plantar calcaneal spur. No acute bony abnormality. Specifically, no fracture, subluxation, or dislocation. IMPRESSION: Small  plantar calcaneal spur.  No acute bony abnormality. Electronically Signed   By: Charlett Nose M.D.   On: 04/20/2018 10:37    Procedures Procedures (including critical care time)  Medications Ordered in ED Medications - No data to display   Initial Impression / Assessment and Plan / ED Course  I have reviewed the triage vital signs and the nursing notes.  Pertinent labs & imaging results that were available during my care of the patient were reviewed by me and considered in my medical decision making (see chart for details).     Final Clinical Impressions(s) / ED Diagnoses   Final diagnoses:  Sprain of right ankle, unspecified ligament, initial encounter   Patient presenting with ankle pain after twisting ankle yesterday.  Vital signs  stable and patient nontoxic-appearing.  X-ray of right foot and ankle negative for acute fracture abnormality.  A splint was applied and crutches given.  patient has an orthopedist. advised to follow-up with either PCP or orthopedics in 1 week for reevaluation.  Advised Tylenol, ibuprofen, and rice protocol for pain.  Advised to return to the ER for any new or worsening symptoms in the meantime.  All questions were answered and patient understands plan and reasons to return.   ED Discharge Orders    None       Karrie MeresCouture, Jairon Ripberger S, PA-C 04/20/18 1045    Melene PlanFloyd, Dan, [place]DO 04/20/18 1121

## 2018-05-27 ENCOUNTER — Encounter (INDEPENDENT_AMBULATORY_CARE_PROVIDER_SITE_OTHER): Payer: Self-pay

## 2018-05-27 ENCOUNTER — Telehealth: Payer: Self-pay | Admitting: Nurse Practitioner

## 2018-05-27 NOTE — Telephone Encounter (Signed)
Patient came in requested for her medication/allegy list to be update.

## 2019-01-14 ENCOUNTER — Emergency Department: Payer: 59

## 2019-01-14 ENCOUNTER — Emergency Department
Admission: EM | Admit: 2019-01-14 | Discharge: 2019-01-14 | Disposition: A | Discharge status: Home / Self Care | Payer: 59 | Attending: Emergency Medicine | Admitting: Emergency Medicine

## 2019-01-14 DIAGNOSIS — M25561 Pain in right knee: Secondary | ICD-10-CM

## 2019-01-14 DIAGNOSIS — E079 Disorder of thyroid, unspecified: Secondary | ICD-10-CM | POA: Insufficient documentation

## 2019-01-14 NOTE — ED Provider Notes (Signed)
None        History     Chief Complaint   Patient presents with    Knee Injury     The history is provided by the patient.        63 yo F with h/o hypothyroidism presents to the ED with right knee pain. Was walking in her son's driveway and fell twisting the knee and heard a pop. Reports severe pain over the medial knee and inability to bear weight. Pain is constant, throbbing better with rest, worse with movement.   Has been taking ibuprofen with minimal relief.   Is in town until May 1st lives in [place].   Denies other injury.   No recent infectious symptoms    Nursing (triage) note reviewed for the following pertinent information:  right knee injury    Past Medical History:   Diagnosis Date    Disorder of thyroid        Past Surgical History:   Procedure Laterality Date    ABDOMINOPLASTY      BACK SURGERY      dental implant      HYSTERECTOMY      TONSILLECTOMY         History reviewed. No pertinent family history.    Social  Social History     Tobacco Use    Smoking status: Never Smoker    Smokeless tobacco: Never Used   Substance Use Topics    Alcohol use: Never     Frequency: Never    Drug use: Never       .     No Known Allergies    Home Medications             ARMOUR THYROID 15 MG tablet          Cholecalciferol (VITAMIN D3) 125 MCG (5000 UT) Tab     Take by mouth     clobetasol (TEMOVATE) 0.05 % cream          ibuprofen (ADVIL,MOTRIN) 600 MG tablet     Take 600 mg by mouth every 6 (six) hours as needed for Pain     LORazepam (ATIVAN) 1 MG tablet                Review of Systems   Respiratory: Negative for shortness of breath.    Cardiovascular: Negative for chest pain.   Gastrointestinal: Negative for nausea and vomiting.   Musculoskeletal: Positive for arthralgias and joint swelling.   Skin: Negative for rash and wound.   Neurological: Negative for weakness and headaches.   All other systems reviewed and are negative.      Physical Exam    BP: 140/82, Heart Rate: 86, Temp: 98.2 F (36.8 C), Resp  Rate: 18, SpO2: 97 %, Weight: 85 kg    Physical Exam  Vitals signs and nursing note reviewed.   HENT:      Head: Normocephalic.   Eyes:      Conjunctiva/sclera: Conjunctivae normal.   Cardiovascular:      Rate and Rhythm: Normal rate.      Pulses: Normal pulses.      Comments: 2+ bilateral dP pulses  Pulmonary:      Effort: Pulmonary effort is normal.   Musculoskeletal:      Comments: Right knee with slight swelling to medial knee, joint deformity, no warmth, no erythema/ecchymosis, TTP over medial joint line, able to flex to 45 degrees, unable to fully extend to 180, but can straight  leg raise, no popliteal swelling, sensaiton intact distally   Skin:     General: Skin is warm and dry.   Neurological:      Mental Status: She is alert.   Psychiatric:         Mood and Affect: Mood normal.           MDM and ED Course     ED Medication Orders (From admission, onward)    None             MDM    63 yo F with no significant past medical history presents to the emergency department with isolated right knee pain ongoing for 2 days after a mechanical twist and fall in her son's driveway    Hemodynamically stable  Suspect meniscal versus ligamentous injury versus fracture   we will get a plain film to evaluate          Results     ** No results found for the last 24 hours. **        Radiology Results (24 Hour)     Procedure Component Value Units Date/Time    Knee 4+ Views Right [[id number]] Collected:  01/14/19 1144    Order Status:  Completed Updated:  01/14/19 1150    Narrative:       Exam:  XR KNEE RIGHT 4+ VIEWS    History: Right knee pain and swelling after a fall.    Comparison: None.    Technique: 4 views of the right knee    Findings:      No acute displaced fracture, abnormal subluxation or dislocation.    Small joint effusion. 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 Subcortical cystic change/lucency in the midportion of  the patella, likely due to overlying cartilage loss. Mild distal   quadriceps tendinosis.      Impression:           1.  No obvious displaced fracture or dislocation.  2.  Small joint effusion.  3.  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     Candi Leash, MD   01/14/2019 11:46 AM        Xray with no fx, small joint effusion, arthritis   Placed in knee immobilizer and crutches, pt given contact information for walk in ortho clinic in [place]   Pt comfortable with plan  Opportunity given to ask questions and all questions asked were answered        Procedures    Clinical Impression & Disposition     Clinical Impression  Final diagnoses:   Acute pain of right knee        ED Disposition     ED Disposition Condition Date/Time Comment    Discharge  Fri Jan 14, 2019 12:02 PM Leighton Parody [place] discharge to home/self care.    Condition at disposition: Stable           Discharge Medication List as of 01/14/2019 12:02 PM                    Nicanor Alcon, MD  01/14/19 1233

## 2019-01-14 NOTE — ED Triage Notes (Signed)
Pt reports falling and twisting right knee 3 days ago-pain getting worse, not able to walk without pain

## 2019-01-14 NOTE — Discharge Instructions (Signed)
You have been seen and evaluated for right knee pain after a fall. There is no evidence of fracture but we can not exclude a ligamentous or meniscal injury. Please do not put weight on the knee while it is painful to help with healing.   You can take ibuprofen and tylenol for pain. Keep the leg elevated to help with swelling.   Please follow up with orthopedic surgery.     Knee Pain NOS    You have been seen for knee pain.    There are a few causes for knee pain. The doctor feels your knee pain is not from an injury to your knee's bones or ligaments.     Injury to the ligaments or bones is not the only cause of knee pain. There are other causes. These include:   Tendonitis. This is the inflammation (swelling) of the tendons. Tendons are the thick cords that connect the muscles around the knee to the bones of the knee joint.   Bursitis. This is the inflammation (swelling) of the fluid-filled sacs that cushion the knee joint.   Arthritis (inflammation of joints).   Gout (swelling of the joints).   Knee injuries from overuse.    Some things you can do to treat your knee pain are:   Apply ice to the knee with an ice pack. Be sure to put a towel between the ice pack and your skin. NEVER PLACE DIRECTLY ON YOUR SKIN. You can do this for 15 minutes at a time, several times a day.   Use anti-inflammatory medicine like ibuprofen (Advil or Motrin) to help the pain and swelling.   Avoid doing things that put a lot of stress on your knee joints. This includes running or playing tennis.    See an orthopedic surgeon about your knee pain.    Use a knee immobilizer. This will stabilize your knee and help with your knee pain.    You should use crutches to help you walk.    YOU SHOULD SEEK MEDICAL ATTENTION IMMEDIATELY, EITHER HERE OR AT THE NEAREST EMERGENCY DEPARTMENT, IF ANY OF THE FOLLOWING OCCUR:   Your knee pain gets worse.   You have fever (temperature higher than 100.93F / 38C) or chills or your knee gets  more red or warm.   You have any other problems or concerns.

## 2019-02-07 ENCOUNTER — Telehealth: Payer: Self-pay | Admitting: Nurse Practitioner

## 2019-02-07 NOTE — Telephone Encounter (Signed)
Called and talked with patient. Patient states that she is not needing about follow up visit. Pt states that she is not in state about.

## 2019-03-10 ENCOUNTER — Telehealth: Payer: Self-pay

## 2019-03-10 NOTE — Telephone Encounter (Signed)
Copied from CRM [id number]. Topic: General - Other >> Mar 09, 2019  4:49 PM Monica Dean wrote: Reason for CRM:  pt called in and stated that there was blood work that was done 04/05/2018 that was [facility].  She stated that per ins and battling for Dean year this blood work was coded wong.  She wants this resubmitted.  Codes for blood work  that other dr used that should be cover  E03.8  E11.9 E78.2  Best number  [phone]

## 2019-03-11 NOTE — Telephone Encounter (Signed)
Archie Patten are you able to help with this? Please

## 2020-07-13 ENCOUNTER — Telehealth: Payer: Medicare Other | Admitting: Physician Assistant

## 2020-07-13 DIAGNOSIS — J029 Acute pharyngitis, unspecified: Secondary | ICD-10-CM | POA: Diagnosis not present

## 2020-07-13 DIAGNOSIS — H9209 Otalgia, unspecified ear: Secondary | ICD-10-CM

## 2020-07-13 MED ORDER — AMOXICILLIN-POT CLAVULANATE 875-125 MG PO TABS: 1.0000 | ORAL_TABLET | Freq: Two times a day (BID) | ORAL | 0 refills | Status: AC

## 2020-07-13 NOTE — Progress Notes (Signed)
E Visit for Ear pain/sore throat  We are sorry that you are not feeling well. Here is how we plan to help!  Based on what you have told me you may have a bacterial infection. In addition to the ear drops I have prescribed an oral antibiotic: and Augmentin 625mg  one tablet by mouth twice a day for 10 days  In certain cases this infection may progress to a more serious bacterial infection of the middle or inner ear.  If you have a fever 102 and up and significantly worsening symptoms, this could indicate a more serious infection moving to the middle/inner and needs face to face evaluation in an office by a provider.  Your symptoms should improve over the next 3 days and should resolve in about 7 days.  HOME CARE:   Wash your hands frequently.  Do not place the tip of the bottle on your ear or touch it with your fingers.  You can take Acetominophen 650 mg every 4-6 hours as needed for pain.  If pain is severe or moderate, you can apply a heating pad (set on low) or hot water bottle (wrapped in a towel) to outer ear for 20 minutes.  This will also increase drainage.  Avoid ear plugs  Do not use Q-tips  After showers, help the water run out by tilting your head to one side.  GET HELP RIGHT AWAY IF:   Fever is over 102.2 degrees.  You develop progressive ear pain or hearing loss.  Ear symptoms persist longer than 3 days after treatment.  MAKE SURE YOU:   Understand these instructions.  Will watch your condition.  Will get help right away if you are not doing well or get worse.  TO PREVENT SWIMMER'S EAR:  Use a bathing cap or custom fitted swim molds to keep your ears dry.  Towel off after swimming to dry your ears.  Tilt your head or pull your earlobes to allow the water to escape your ear canal.  If there is still water in your ears, consider using a hairdryer on the lowest setting.  Thank you for choosing an e-visit. Your e-visit answers were reviewed by a board  certified advanced clinical practitioner to complete your personal care plan. Depending upon the condition, your plan could have included both over the counter or prescription medications. Please review your pharmacy choice. Be sure that the pharmacy you have chosen is open so that you can pick up your prescription now.  If there is a problem you may message your provider in MyChart to have the prescription routed to another pharmacy. Your safety is important to . If you have drug allergies check your prescription carefully.  For the next 24 hours, you can use MyChart to ask questions about today's visit, request a non-urgent call back, or ask for a work or school excuse from your e-visit provider. You will get an email in the next two days asking about your experience. I hope that your e-visit has been valuable and will speed your recovery.  Approximately 5 minutes was spent documenting and reviewing patient's chart.

## 2020-07-13 NOTE — Progress Notes (Signed)
This is a duplicate visit. Please refer to prior evisit from earlier today.    NOTE: If you entered your credit card information for this eVisit, you will not be charged. You may see a "hold" on your card for the $35 but that hold will drop off and you will not have a charge processed.   If you are having a true medical emergency please call 911.      For an urgent face to face visit, [facility] has five urgent care centers for your convenience:      NEW:  [facility] at [facility] Directions [phone] [address] [place], [place] [address] . 10 am - 6pm Monday - Friday    [facility]) Get Driving Directions [phone] [address] [place], [place] [address] . 10 am to 8 pm Monday-Friday . 12 pm to 8 pm [facility] Urgent Care at [facility] Get Driving Directions [phone] [address] [place], [place] [address] . 8 am to 8 pm Monday-Friday . 9 am to 6 pm Saturday . 11 am to 6 pm Sunday     [facility] Urgent Care at [facility] Get Driving Directions  [phone] [address] [place], [place] [address] . 8 am to 8 pm Monday-Friday . 8 am to 4 pm [facility] Urgent Care at [facility] Directions [phone] [address] [place], [place] [address] . 12 pm to 6 pm Monday-Friday      Your e-visit answers were reviewed by a board certified advanced clinical practitioner to complete your personal care plan.  Thank you for using e-Visits.

## 2021-11-07 ENCOUNTER — Other Ambulatory Visit: Payer: Self-pay | Admitting: Gastroenterology

## 2021-11-07 DIAGNOSIS — R1310 Dysphagia, unspecified: Secondary | ICD-10-CM

## 2021-11-11 ENCOUNTER — Ambulatory Visit
Admission: RE | Admit: 2021-11-11 | Discharge: 2021-11-11 | Disposition: A | Discharge status: Home / Self Care | Payer: Medicare Other | Source: Ambulatory Visit | Attending: Gastroenterology | Admitting: Gastroenterology

## 2021-11-11 DIAGNOSIS — R1310 Dysphagia, unspecified: Secondary | ICD-10-CM

## 2021-12-29 ENCOUNTER — Emergency Department: Payer: Medicare Other

## 2021-12-29 ENCOUNTER — Emergency Department
Admission: EM | Admit: 2021-12-29 | Discharge: 2021-12-30 | Disposition: A | Discharge status: Home / Self Care | Payer: Medicare Other | Attending: Emergency Medicine | Admitting: Emergency Medicine

## 2021-12-29 DIAGNOSIS — M722 Plantar fascial fibromatosis: Secondary | ICD-10-CM | POA: Insufficient documentation

## 2021-12-29 LAB — GFR: EGFR: 49.6

## 2021-12-29 LAB — CBC AND DIFFERENTIAL
Absolute NRBC: 0 10*3/uL (ref 0.00–0.00)
Basophils Absolute Automated: 0.04 10*3/uL (ref 0.00–0.08)
Basophils Automated: 0.5 %
Eosinophils Absolute Automated: 0.11 10*3/uL (ref 0.00–0.44)
Eosinophils Automated: 1.4 %
Hematocrit: 41.6 % (ref 34.7–43.7)
Hgb: 13.9 g/dL (ref 11.4–14.8)
Immature Granulocytes Absolute: 0.05 10*3/uL (ref 0.00–0.07)
Immature Granulocytes: 0.6 %
Instrument Absolute Neutrophil Count: 4.29 10*3/uL (ref 1.10–6.33)
Lymphocytes Absolute Automated: 2.6 10*3/uL (ref 0.42–3.22)
Lymphocytes Automated: 32.8 %
MCH: 28.7 pg (ref 25.1–33.5)
MCHC: 33.4 g/dL (ref 31.5–35.8)
MCV: 85.8 fL (ref 78.0–96.0)
MPV: 10.9 fL (ref 8.9–12.5)
Monocytes Absolute Automated: 0.83 10*3/uL (ref 0.21–0.85)
Monocytes: 10.5 %
Neutrophils Absolute: 4.29 10*3/uL (ref 1.10–6.33)
Neutrophils: 54.2 %
Nucleated RBC: 0 /100 WBC (ref 0.0–0.0)
Platelets: 198 10*3/uL (ref 142–346)
RBC: 4.85 10*6/uL (ref 3.90–5.10)
RDW: 13 % (ref 11–15)
WBC: 7.92 10*3/uL (ref 3.10–9.50)

## 2021-12-29 LAB — COMPREHENSIVE METABOLIC PANEL
ALT: 17 U/L (ref 0–55)
AST (SGOT): 17 U/L (ref 5–41)
Albumin/Globulin Ratio: 1.2 (ref 0.9–2.2)
Albumin: 3.8 g/dL (ref 3.5–5.0)
Alkaline Phosphatase: 84 U/L (ref 37–117)
Anion Gap: 7 (ref 5.0–15.0)
BUN: 24 mg/dL — ABNORMAL HIGH (ref 7.0–21.0)
Bilirubin, Total: 0.3 mg/dL (ref 0.2–1.2)
CO2: 27 mEq/L (ref 17–29)
Calcium: 9.4 mg/dL (ref 8.5–10.5)
Chloride: 108 mEq/L (ref 99–111)
Creatinine: 1.1 mg/dL — ABNORMAL HIGH (ref 0.4–1.0)
Globulin: 3.1 g/dL (ref 2.0–3.6)
Glucose: 107 mg/dL — ABNORMAL HIGH (ref 70–100)
Potassium: 3.8 mEq/L (ref 3.5–5.3)
Protein, Total: 6.9 g/dL (ref 6.0–8.3)
Sodium: 142 mEq/L (ref 135–145)

## 2021-12-29 LAB — SEDIMENTATION RATE: Sed Rate: 10 mm/Hr (ref 0–20)

## 2021-12-29 LAB — C-REACTIVE PROTEIN: C-Reactive Protein: 0.9 mg/dL (ref 0.0–1.1)

## 2021-12-29 LAB — MAGNESIUM: Magnesium: 2.2 mg/dL (ref 1.6–2.6)

## 2021-12-29 MED ORDER — TRAMADOL HCL 50 MG PO TABS
50.0000 mg | ORAL_TABLET | Freq: Once | ORAL | Status: DC
Start: 2021-12-29 — End: 2021-12-30

## 2021-12-29 MED ORDER — SODIUM CHLORIDE 0.9 % IV BOLUS
1000.00 mL | Freq: Once | INTRAVENOUS | Status: DC
Start: 2021-12-29 — End: 2021-12-30

## 2021-12-29 NOTE — ED Triage Notes (Signed)
Pt c/o cramps in her R foot. Family at bedside. Pt had leg cramps that began prior to her travel out of town. Pt went to [facility] on March 15th. On her trip her leg cramps resolved but she started having R foot instep/arch cramps. Pt went to the ED while on the trip and they suggested pain management and gave her a walking boot. They did an ultrasound to check for blood clots and checked a Ddimer as well. Pt flew back into town yesterday but the cramping has gotten worse. Pt denies numbness, tingling, dizziness, SOB, n/v/d.

## 2022-01-01 NOTE — ED Provider Notes (Signed)
History     Chief Complaint   Patient presents with    Muscle Cramps     65 yof c/o pain in her right foot. No known injury. It started after she had a horrible muscle cramp. She was seen in [facility] for this and had a negative doppler. She was given a walking boot which she says makes it worse.     The history is provided by the patient.   Foot Injury  Location:  Foot  Injury: no    Foot location:  Sole of R foot  Pain details:     Quality:  Aching    Radiates to:  Does not radiate    Severity:  Moderate    Timing:  Constant    Progression:  Unchanged  Chronicity:  New  Prior injury to area:  No  Relieved by:  Nothing  Worsened by:  Bearing weight  Ineffective treatments:  Immobilization  Associated symptoms: no numbness and no swelling         Past Medical History:   Diagnosis Date    Disorder of thyroid        Past Surgical History:   Procedure Laterality Date    ABDOMINOPLASTY      BACK SURGERY      dental implant      HYSTERECTOMY      TONSILLECTOMY         History reviewed. No pertinent family history.    Social  Social History     Tobacco Use    Smoking status: Never    Smokeless tobacco: Never   Vaping Use    Vaping status: Never Used   Substance Use Topics    Alcohol use: Never    Drug use: Never       .     No Known Allergies    Home Medications               ARMOUR THYROID 15 MG tablet          Biotin 1 MG Cap     Take by mouth     Cholecalciferol (VITAMIN D3) 125 MCG (5000 UT) Tab     Take by mouth     Elderberry 500 MG Cap     Take by mouth     fexofenadine (ALLEGRA) 180 MG tablet     Take 90 mg by mouth daily     ibuprofen (ADVIL,MOTRIN) 600 MG tablet     Take 600 mg by mouth every 6 (six) hours as needed for Pain     Ruxolitinib Phosphate 1.5 % Cream     Apply topically     saccharomyces boulardii (FLORASTOR) 250 MG capsule     Take 250 mg by mouth 2 (two) times daily     vitamin B-12 (CYANOCOBALAMIN) 500 MCG tablet     Take 500 mcg by mouth daily          Flagged for Removal                clobetasol (TEMOVATE) 0.05 % cream          LORazepam (ATIVAN) 1 MG tablet                  Review of Systems    Physical Exam    BP: 128/76, Heart Rate: 89, Temp: 98.2 F (36.8 C), Resp Rate: 17, SpO2: 97 %, Weight: 78.5 kg    Physical Exam  Vitals and nursing  note reviewed.   Constitutional:       General: She is not in acute distress.  HENT:      Head: Normocephalic and atraumatic.   Eyes:      General:         Right eye: No discharge.         Left eye: No discharge.      Conjunctiva/sclera: Conjunctivae normal.   Cardiovascular:      Rate and Rhythm: Normal rate and regular rhythm.   Pulmonary:      Effort: Pulmonary effort is normal. No respiratory distress.   Musculoskeletal:         General: Tenderness present. No swelling, deformity or signs of injury.      Right foot: Normal range of motion. Tenderness and bony tenderness present. No swelling.      Comments: Pain to bones in medial plantar aspect of right mid foot   Skin:     Findings: No erythema or rash.   Neurological:      General: No focal deficit present.      Mental Status: She is alert and oriented to person, place, and time.   Psychiatric:         Mood and Affect: Mood normal.         Behavior: Behavior normal.           MDM and ED Course     ED Medication Orders (From admission, onward)      Start Ordered     Status Ordering Provider    12/29/21 2201 12/29/21 2200    Once        Route: Intravenous  Ordered Dose: 1,000 mL     Discontinued Elyn AquasKLUKOWSKI, Kalvin Buss J    12/29/21 2201 12/29/21 2200    Once        Route: Oral  Ordered Dose: 50 mg     Discontinued Elyn AquasKLUKOWSKI, Antuan Limes J               Medical Decision Making  Amount and/or Complexity of Data Reviewed  Labs: ordered.  Radiology: ordered.      Results       Procedure Component Value Units Date/Time    Sedimentation rate (ESR) [[id number]][[id number]] Collected: 12/29/21 2248    Specimen: Blood Updated: 12/29/21 2331     Sed Rate 10 mm/Hr     Magnesium [[id number]][[id number]] Collected: 12/29/21 2248    Specimen: Blood  Updated: 12/29/21 2312     Magnesium 2.2 mg/dL     GFR [[id number]][[id number]] Collected: 12/29/21 2248     Updated: 12/29/21 2312     EGFR 49.6       Comprehensive metabolic panel [[id number]][[id number]]  (Abnormal) Collected: 12/29/21 2248    Specimen: Blood Updated: 12/29/21 2312     Glucose 107 mg/dL      BUN 62.924.0 mg/dL      Creatinine 1.1 mg/dL      Sodium 528142 mEq/L      Potassium 3.8 mEq/L      Chloride 108 mEq/L      CO2 27 mEq/L      Calcium 9.4 mg/dL      Protein, Total 6.9 g/dL      Albumin 3.8 g/dL      AST (SGOT) 17 U/L      ALT 17 U/L      Alkaline Phosphatase 84 U/L      Bilirubin, Total 0.3 mg/dL      Globulin 3.1 g/dL  Albumin/Globulin Ratio 1.2     Anion Gap 7.0    C Reactive Protein [[id number]] Collected: 12/29/21 2248    Specimen: Blood Updated: 12/29/21 2312     C-Reactive Protein 0.9 mg/dL     CBC and differential [[id number]] Collected: 12/29/21 2248    Specimen: Blood Updated: 12/29/21 2257     WBC 7.92 x10 3/uL      Hgb 13.9 g/dL      Hematocrit 78.2 %      Platelets 198 x10 3/uL      RBC 4.85 x10 6/uL      MCV 85.8 fL      MCH 28.7 pg      MCHC 33.4 g/dL      RDW 13 %      MPV 10.9 fL      Instrument Absolute Neutrophil Count 4.29 x10 3/uL      Neutrophils 54.2 %      Lymphocytes Automated 32.8 %      Monocytes 10.5 %      Eosinophils Automated 1.4 %      Basophils Automated 0.5 %      Immature Granulocytes 0.6 %      Nucleated RBC 0.0 /100 WBC      Neutrophils Absolute 4.29 x10 3/uL      Lymphocytes Absolute Automated 2.60 x10 3/uL      Monocytes Absolute Automated 0.83 x10 3/uL      Eosinophils Absolute Automated 0.11 x10 3/uL      Basophils Absolute Automated 0.04 x10 3/uL      Immature Granulocytes Absolute 0.05 x10 3/uL      Absolute NRBC 0.00 x10 3/uL             Radiology Results (24 Hour)       Procedure Component Value Units Date/Time    XR Foot Right AP Lateral And Oblique [[id number]] Collected: 12/29/21 2311    Order Status: Completed Updated: 12/29/21 2316    Narrative:      HISTORY: Right midfoot  pain. No known injury.    COMPARISON: None.    FINDINGS:    No acute fracture or malalignment. There is mild hallux valgus and  prominent bony bunion formation. The remaining joint spaces are  preserved. There is a plantar calcaneal spur. There are nonspecific  calcific/ossific densities along the distal Achilles tendon and proximal  plantar fascia. The remaining visualized soft tissues are unremarkable.        Impression:          1.No acute fracture or malalignment.    2.Chronic findings as discussed above.    Vassie Moment, MD  12/29/2021 11:14 PM            I have reviewed all labs and/or radiological studies. I have reviewed all xrays if any myself on the PACS system.          Number and Complexity of Problems Addressed (select at least one)    Complexity: Moderate: 1 undiagnosed new problem with uncertain prognosis      Presenting acute/chronic problems: right foot pain    Differential diagnosis to include but not limited to: sprain, arthritis, tendonitis, stress fracture, plantar fasciitis    Chronic illness impacting care and increasing risk of acute/chronic problems (obesity based on bmi >30, diabetes, hypertension, elderly - 19 or older): over age 70    ______________________________________________________________________  Amount/complexity of Data Reviewed   L\  Look below for information    History obtained from another  historian -see HPI or here (parent, spouse,  care giver, ems) : husband    Review of older external records from [facility] and found this relevant information: no recent prescriptions, ativan and gabapentin received 9/22       Independent visualized and interpretation of radiological study by me:     Type of radiological study performed : right foot xray  Independent Interpretation by me: no acute fracture, calcaneal spur    Diagnostic tests appropriately considered even if not ultimately performed: MRI can be done as an outpatient.     Discussion of management with other physicians and/or other  caretakers:   Discussion with no one. F/U with podiatry or ortho    ______________________________________________________________________    Risk of Complications and/or Morbidity or Mortality of Patient Management  (select one if applicable)    Risk: Moderate               Procedures    Clinical Impression & Disposition     Clinical Impression  Final diagnoses:   Plantar fasciitis of right foot        ED Disposition       ED Disposition   Discharge    Condition   --    Date/Time   Sun Dec 29, 2021 11:38 PM    Comment   Camilia Caywood Hesler discharge to home/self care.    Condition at disposition: Stable                  Discharge Medication List as of 12/29/2021 11:38 PM                      Terance Ice, DO  01/01/22 [phone]

## 2022-06-19 ENCOUNTER — Ambulatory Visit
Admission: EM | Admit: 2022-06-19 | Discharge: 2022-06-19 | Disposition: A | Discharge status: Home / Self Care | Payer: Medicare Other

## 2022-06-19 DIAGNOSIS — H6982 Other specified disorders of Eustachian tube, left ear: Secondary | ICD-10-CM | POA: Diagnosis not present

## 2022-06-19 DIAGNOSIS — J309 Allergic rhinitis, unspecified: Secondary | ICD-10-CM

## 2022-06-19 NOTE — Discharge Instructions (Addendum)
I have enclosed some information regarding eustachian tube dysfunction that I hope you find helpful.  Please reach out to an ears nose and throat specialist if you are interested in further evaluation and treatment of this issue.

## 2022-06-19 NOTE — ED Triage Notes (Signed)
Pt c/o left ear clogging, that started about a week ago.   Home interventions: none

## 2022-06-19 NOTE — ED Provider Notes (Signed)
[facility]    CSN: [id number] Arrival date & time: 06/19/22  1541    HISTORY   Chief Complaint  Patient presents with   Ear Fullness   HPI Monica Dean is a pleasant, 66 y.o. female who presents to urgent care today. Patient reports a 1 week history of her left ear feeling clogged.  Patient states she has not tried anything to remedy her symptoms.  EMR reviewed.  Patient has a history of eustachian tube dysfunction, previously seen by otolaryngology who advised her to use Flonase regularly however she states she is not doing this because she does not like to put anything in her nose. Patient is a poorly controlled type II diabetic who refuses treatment, last hemoglobin A1c was 8.7 which was 4 years ago, her primary care NP has not checked her A1c since then.  The history is provided by the patient.   Past Medical History:  Diagnosis Date   Allergy    Arthritis    Chicken pox    Diabetes mellitus without complication (HCC)    Diverticulitis    Eosinophilic fasciitis    Frequent headaches    GERD (gastroesophageal reflux disease)    Hyperlipidemia    Hypothyroid    Migraines    Multiple allergies    UTI (urinary tract infection)    Patient Active Problem List   Diagnosis Date Noted   Sjogren's syndrome (HCC) 04/15/2018   Hair loss 04/15/2018   Convulsions (HCC) 04/14/2018   Dizziness and giddiness 04/14/2018   S/P total hysterectomy 04/14/2018   Vitamin D deficiency 04/14/2018   Pain in joints 01/22/2016   Body mass index (bmi) 30.0-30.9, adult 11/20/2015   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    Home Medications    Prior to Admission medications   Medication Sig Start Date End Date Taking? Authorizing Provider  ARMOUR THYROID 15 MG tablet Take 30 mg by mouth every morning. 03/05/18   [provider]  Cholecalciferol (VITAMIN D3) 5000 units TABS Take by mouth.    [provider]  Cyanocobalamin (B-12 PO) Take by mouth.    [provider]  fexofenadine (ALLEGRA) 180 MG tablet Take 180 mg by mouth daily. 1/2 (total 90 mg) tab daily for itchy    [provider]  FREESTYLE LITE test strip CHECK BLOOD SUGAR ONCE DAILY 01/18/18   [provider]  OVER THE COUNTER MEDICATION Essential Oil.    [provider]  OVER THE COUNTER MEDICATION Tumeric    [provider]    Family History Family History  Problem Relation Age of Onset   Alcohol abuse Mother    Alcohol abuse Father    Social History Social History   Tobacco Use   Smoking status: Never   Smokeless tobacco: Never  Vaping Use   Vaping Use: Never used  Substance Use Topics   Alcohol use: No   Drug use: No   Allergies   Codeine, Diazepam, Eszopiclone, Morphine, Other, and Oxycodone hcl  Review of Systems Review  of Systems Pertinent findings revealed after performing a 14 point review of systems has been noted in the history of present illness.  Physical Exam Triage Vital Signs ED Triage Vitals  Enc Vitals Group     BP 08/02/21 0827 (!) 147/82     Pulse Rate 08/02/21 0827 72     Resp 08/02/21 0827 18     Temp 08/02/21 0827 98.3 F (36.8 C)     Temp Source 08/02/21 0827 Oral     SpO2 08/02/21 0827 98 %     Weight --      Height --      Head Circumference --      Peak Flow --      Pain Score 08/02/21 0826 5     Pain Loc --      Pain Edu? --      Excl. in GC? --   No data found.  Updated Vital Signs BP 118/77 (BP Location: Right Arm)   Pulse 73   Temp 98.8 F (37.1 C) (Oral)   Resp 18    SpO2 95%   Physical Exam Vitals and nursing note reviewed.  Constitutional:      General: She is not in acute distress.    Appearance: Normal appearance. She is not ill-appearing.  HENT:     Head: Normocephalic and atraumatic.     Salivary Glands: Right salivary gland is not diffusely enlarged or tender. Left salivary gland is not diffusely enlarged or tender.     Right Ear: Ear canal and external ear normal. No drainage. A middle ear effusion is present. There is no impacted cerumen. Tympanic membrane is bulging. Tympanic membrane is not injected or erythematous.     Left Ear: Ear canal and external ear normal. No drainage. A middle ear effusion is present. There is no impacted cerumen. Tympanic membrane is bulging. Tympanic membrane is not injected or erythematous.     Ears:     Comments: Bilateral EACs normal, both TMs bulging with clear fluid    Nose: Rhinorrhea present. No nasal deformity, septal deviation, signs of injury, nasal tenderness, mucosal edema or congestion. Rhinorrhea is clear.     Right Nostril: Occlusion present. No foreign body, epistaxis or septal hematoma.     Left Nostril: Occlusion present. No foreign body, epistaxis or septal hematoma.     Right Turbinates: Enlarged, swollen and pale.     Left Turbinates: Enlarged, swollen and pale.     Right Sinus: No maxillary sinus tenderness or frontal sinus tenderness.     Left Sinus: No maxillary sinus tenderness or frontal sinus tenderness.     Mouth/Throat:     Lips: Pink. No lesions.     Mouth: Mucous membranes are moist. No oral lesions.     Pharynx: Oropharynx is clear. Uvula midline. No posterior oropharyngeal erythema or uvula swelling.     Tonsils: No tonsillar exudate. 0 on the right. 0 on the left.     Comments: Postnasal drip Eyes:     General: Lids are normal.        Right eye: No discharge.        Left eye: No discharge.     Extraocular Movements: Extraocular movements intact.     Conjunctiva/sclera:  Conjunctivae normal.     Right eye: Right conjunctiva is not injected.     Left eye: Left conjunctiva is not injected.  Neck:     Trachea: Trachea and phonation normal.  Cardiovascular:  Rate and Rhythm: Normal rate and regular rhythm.     Pulses: Normal pulses.     Heart sounds: Normal heart sounds. No murmur heard.    No friction rub. No gallop.  Pulmonary:     Effort: Pulmonary effort is normal. No accessory muscle usage, prolonged expiration or respiratory distress.     Breath sounds: Normal breath sounds. No stridor, decreased air movement or transmitted upper airway sounds. No decreased breath sounds, wheezing, rhonchi or rales.  Chest:     Chest wall: No tenderness.  Musculoskeletal:        General: Normal range of motion.     Cervical back: Normal range of motion and neck supple. Normal range of motion.  Lymphadenopathy:     Cervical: No cervical adenopathy.  Skin:    General: Skin is warm and dry.     Findings: No erythema or rash.  Neurological:     General: No focal deficit present.     Mental Status: She is alert and oriented to person, place, and time.  Psychiatric:        Mood and Affect: Mood normal.        Behavior: Behavior normal.     Visual Acuity Right Eye Distance:   Left Eye Distance:   Bilateral Distance:    Right Eye Near:   Left Eye Near:    Bilateral Near:     UC Couse / Diagnostics / Procedures:     Radiology No results found.  Procedures Procedures (including critical care time) EKG  Pending results:  Labs Reviewed - No data to display  Medications Ordered in UC: Medications - No data to display  UC Diagnoses / Final Clinical Impressions(s)   I have reviewed the triage vital signs and the nursing notes.  Pertinent labs & imaging results that were available during my care of the patient were reviewed by me and considered in my medical decision making (see chart for details).    Final diagnoses:  Allergic rhinitis,  unspecified seasonality, unspecified trigger  Chronic Eustachian tube dysfunction, left   Patient advised I recommend Flonase for relief of eustachian tube dysfunction.  Patient states she is not interested in trying this.  Patient advised to follow-up with the ENT.  ED Prescriptions   None    PDMP not reviewed this encounter.  Pending results:  Labs Reviewed - No data to display  Discharge Instructions:   Discharge Instructions      I have enclosed some information regarding eustachian tube dysfunction that I hope you find helpful.  Please reach out to an ears nose and throat specialist if you are interested in further evaluation and treatment of this issue.      Disposition Upon Discharge:  Condition: stable for discharge home  Patient presented with an acute illness with associated systemic symptoms and significant discomfort requiring urgent management. In my opinion, this is a condition that a prudent lay person (someone who possesses an average knowledge of health and medicine) may potentially expect to result in complications if not addressed urgently such as respiratory distress, impairment of bodily function or dysfunction of bodily organs.   Routine symptom specific, illness specific and/or disease specific instructions were discussed with the patient and/or caregiver at length.   As such, the patient has been evaluated and assessed, work-up was performed and treatment was provided in alignment with urgent care protocols and evidence based medicine.  Patient/parent/caregiver has been advised that the patient may require follow up for further  testing and treatment if the symptoms continue in spite of treatment, as clinically indicated and appropriate.  Patient/parent/caregiver has been advised to return to the [facility] or PCP if no better; to PCP or the Emergency Department if new signs and symptoms develop, or if the current signs or symptoms continue to change or worsen for  further workup, evaluation and treatment as clinically indicated and appropriate  The patient will follow up with their current PCP if and as advised. If the patient does not currently have a PCP we will assist them in obtaining one.   The patient may need specialty follow up if the symptoms continue, in spite of conservative treatment and management, for further workup, evaluation, consultation and treatment as clinically indicated and appropriate.   Patient/parent/caregiver verbalized understanding and agreement of plan as discussed.  All questions were addressed during visit.  Please see discharge instructions below for further details of plan.  This office note has been dictated using Teaching laboratory technician.  Unfortunately, this method of dictation can sometimes lead to typographical or grammatical errors.  I apologize for your inconvenience in advance if this occurs.  Please do not hesitate to reach out to me if clarification is needed.      Theadora Rama Scales, PA-C 06/19/22 1627

## 2022-10-11 IMAGING — RF DG ESOPHAGUS
9 series · 14 of 24 positions shown · non-contrast
Comparison: NONE.

CLINICAL DATA: Patient with history of Zhu disease, GERD
presents with worsening symptoms after recently stopping reflux
medication.

EXAM:
ESOPHAGUS/BARIUM SWALLOW/TABLET STUDY
TECHNIQUE: Combined double and single contrast examination was performed using
effervescent crystals, high-density barium, and thin liquid barium.
This exam was performed by Tjahjo Hsmusic, and was supervised
and interpreted by Akoto, Mikey.
FLUOROSCOPY TIME:  Radiation Exposure Index (as provided by the
fluoroscopic device): 15.797 mGy

[Series 1: sequence · 2 of 25 frames shown (1 of 8)]
[frame 1/25]
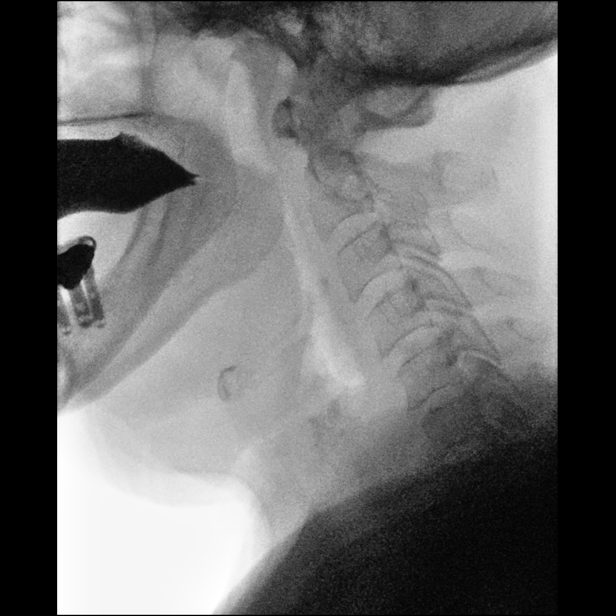
[frame 22/25]
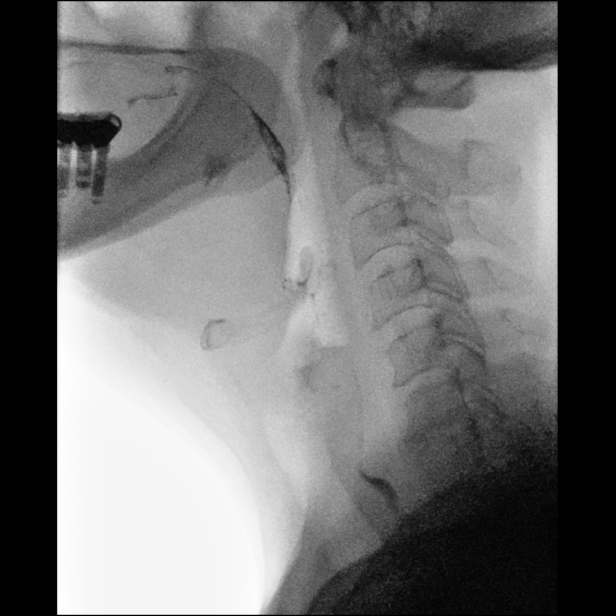

[Series 3: sequence · 1 of 14 frames shown (2 of 8)]
[frame 5/14]
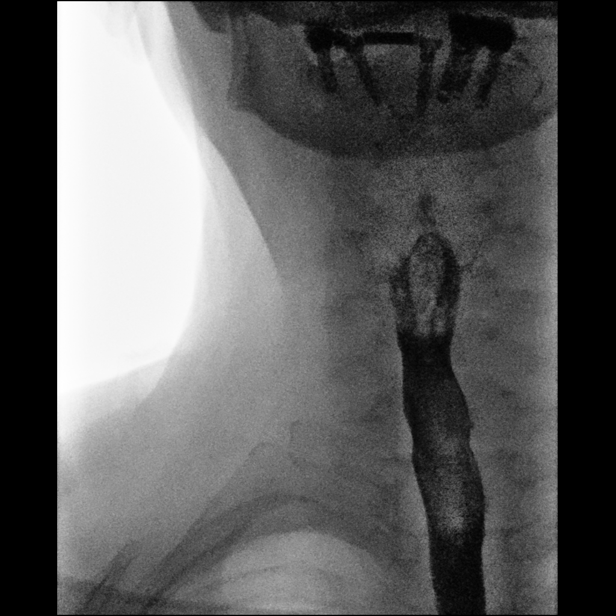

[Series 4: sequence · 2 of 32 frames shown (3 of 8)]
[frame 5/32]
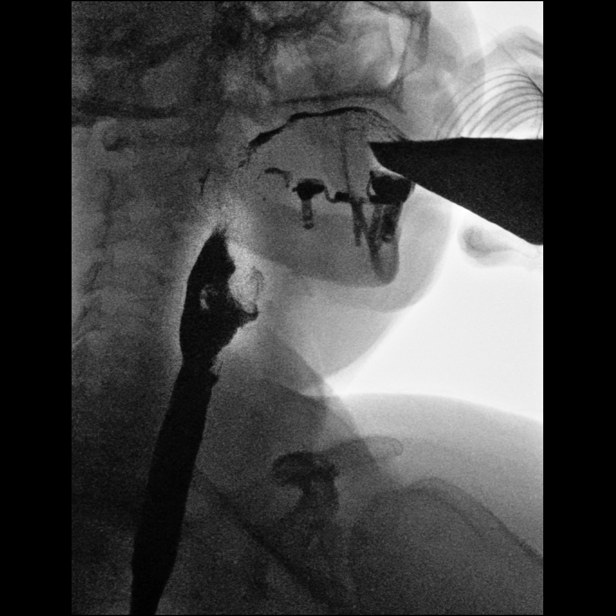
[frame 21/32]
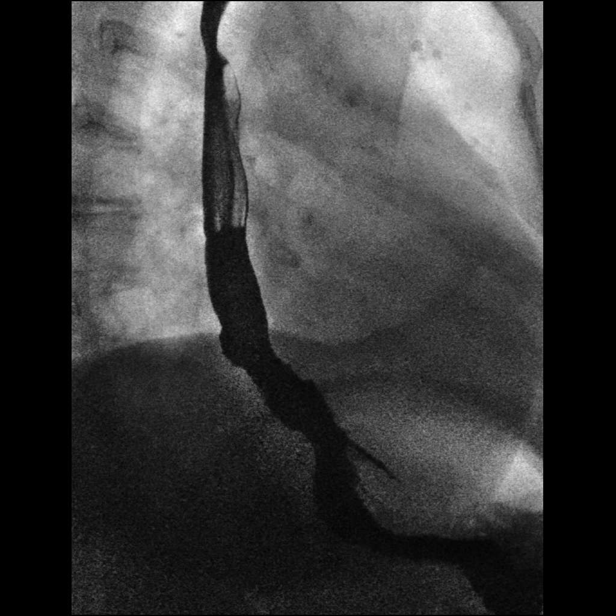

[Series 5: one shot · 0.15mm/px · 2 of 5 slices shown]
[im 2/5]
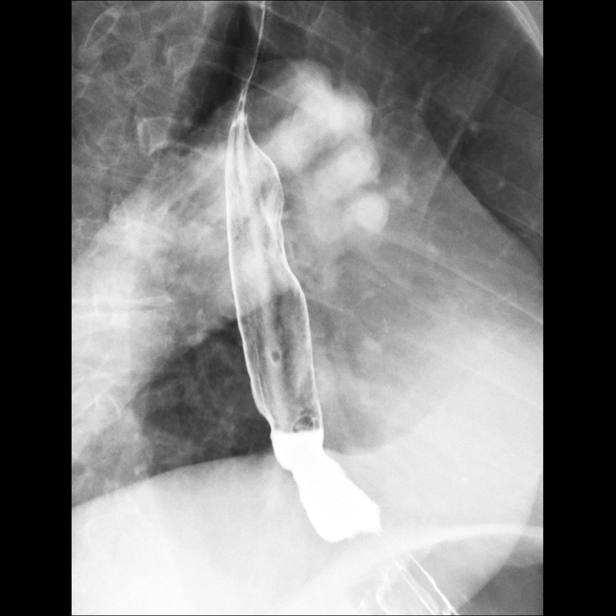
[im 5/5]
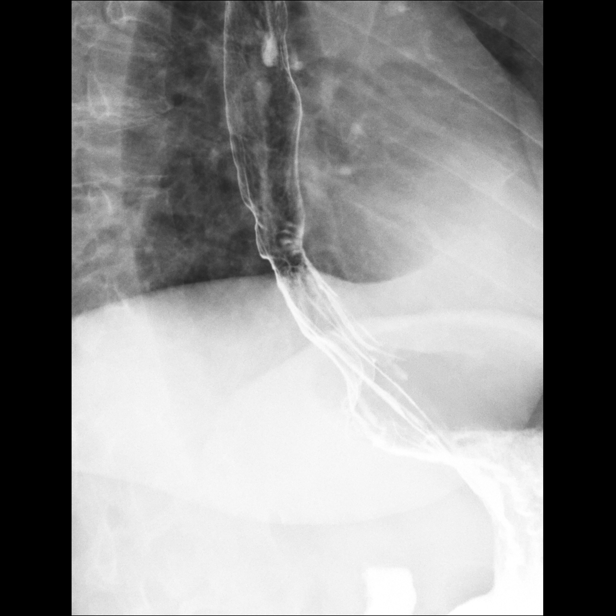

[Series 6: sequence · 1 of 1 slices shown (4 of 8)]
[im 1/1]
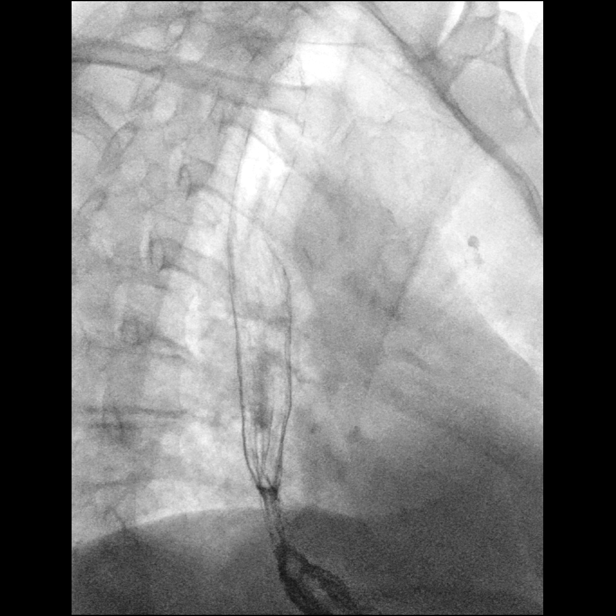

[Series 8: sequence · 1 of 40 frames shown (5 of 8)]
[frame 7/40]
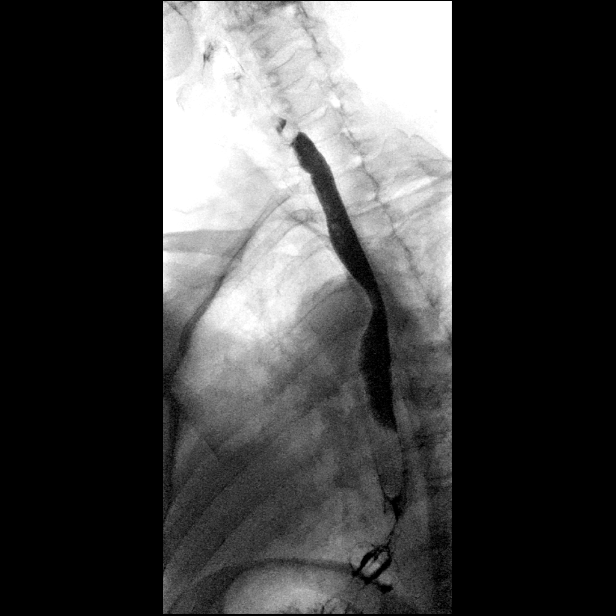

[Series 9: sequence · 2 of 109 frames shown (6 of 8)]
[frame 17/109]
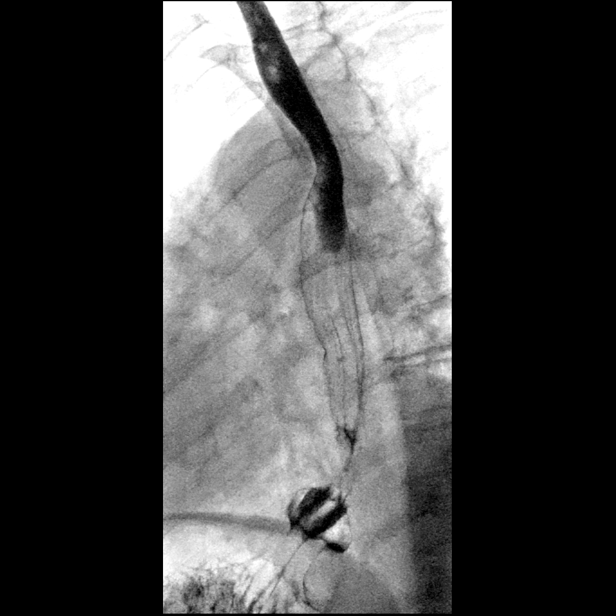
[frame 93/109]
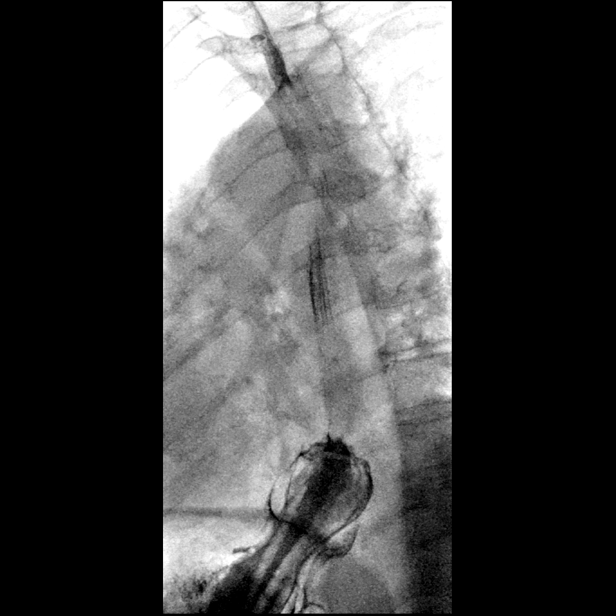

[Series 10: sequence · 1 of 56 frames shown (7 of 8)]
[frame 9/56]
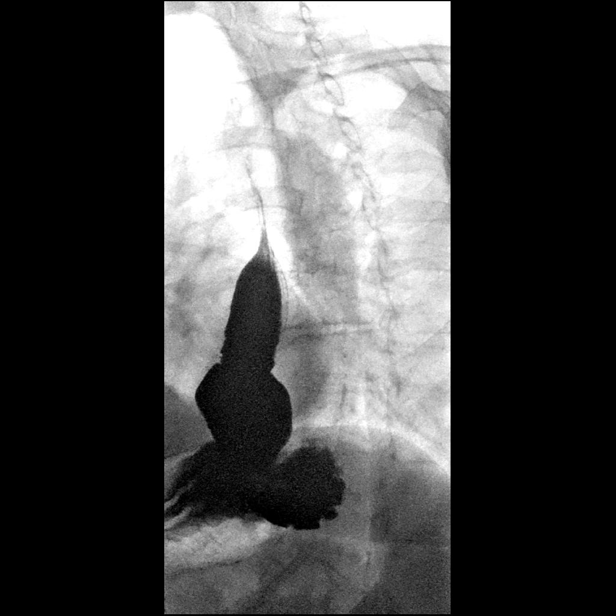

[Series 11: sequence · 2 of 27 frames shown (8 of 8)]
[frame 5/27]
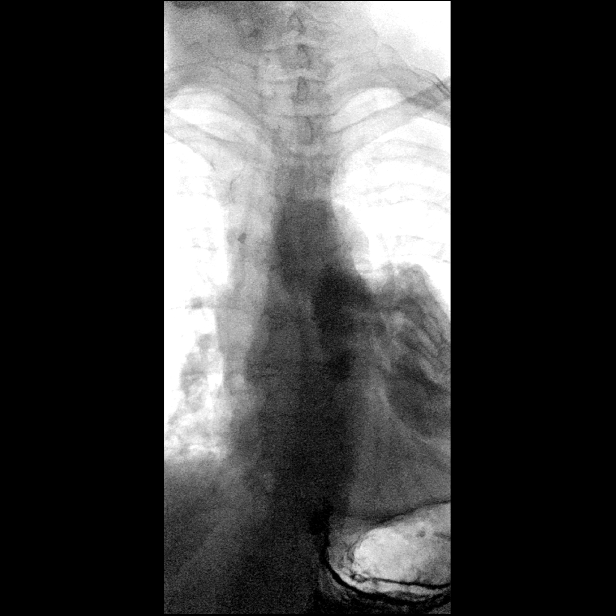
[frame 23/27]
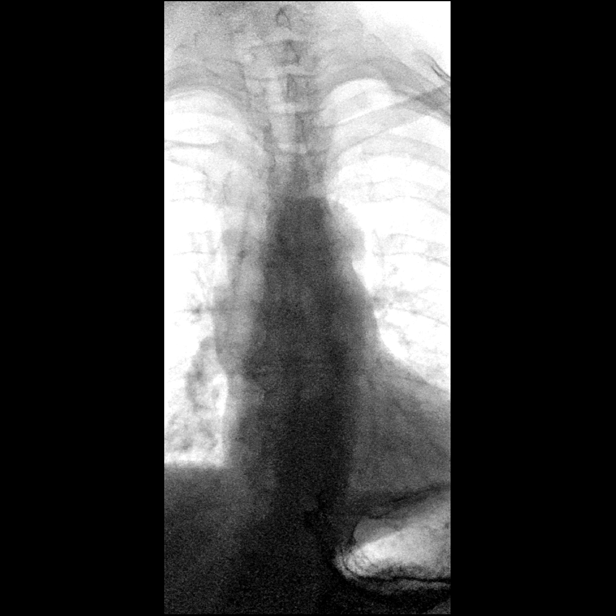

[14 of 24 positions shown; findings below may reference images not displayed]

FINDINGS: Swallowing: Normal oral and pharyngeal phases of swallowing. No
vestibular penetration or aspiration seen. Mild cricopharyngeus
muscle dysfunction characterized by incomplete opening.

Pharynx: No evidence of mass, stricture, or diverticulum. No
significant cricopharyngeal hypertrophy. No retention of barium in
the pharynx.

Esophagus: Mild granularity of the lower thoracic esophagus with
slight eccentric puckering of the lower thoracic esophagus just
above the esophagogastric junction. No evidence of mass or
stricture.

Esophageal motility: Within normal limits.

Hiatal Hernia: Small hiatal hernia

Gastroesophageal reflux: Spontaneous reflux observed to the level of
the upper thoracic esophagus.

Ingested 13mm barium tablet: Passed normally

Other: None.
IMPRESSION: 1. Small hiatal hernia. Marked spontaneous gastroesophageal reflux
observed.
2. Evidence of reflux esophagitis including slight eccentric
puckering of the lower thoracic esophagus just above the
esophagogastric junction. Consider upper endoscopic correlation. No
evidence of esophageal mass or clinically significant stricture.

## 2023-07-01 ENCOUNTER — Emergency Department (HOSPITAL_BASED_OUTPATIENT_CLINIC_OR_DEPARTMENT_OTHER): Payer: Medicare Other

## 2023-07-01 ENCOUNTER — Other Ambulatory Visit: Payer: Self-pay

## 2023-07-01 ENCOUNTER — Emergency Department (HOSPITAL_BASED_OUTPATIENT_CLINIC_OR_DEPARTMENT_OTHER)
Admission: EM | Admit: 2023-07-01 | Discharge: 2023-07-01 | Disposition: A | Discharge status: Home / Self Care | Payer: Medicare Other | Attending: Emergency Medicine | Admitting: Emergency Medicine

## 2023-07-01 ENCOUNTER — Encounter (HOSPITAL_BASED_OUTPATIENT_CLINIC_OR_DEPARTMENT_OTHER): Payer: Self-pay

## 2023-07-01 DIAGNOSIS — S60512A Abrasion of left hand, initial encounter: Secondary | ICD-10-CM | POA: Insufficient documentation

## 2023-07-01 DIAGNOSIS — S60511A Abrasion of right hand, initial encounter: Secondary | ICD-10-CM | POA: Diagnosis not present

## 2023-07-01 DIAGNOSIS — M25562 Pain in left knee: Secondary | ICD-10-CM | POA: Insufficient documentation

## 2023-07-01 DIAGNOSIS — S6991XA Unspecified injury of right wrist, hand and finger(s), initial encounter: Secondary | ICD-10-CM | POA: Diagnosis present

## 2023-07-01 DIAGNOSIS — M25561 Pain in right knee: Secondary | ICD-10-CM | POA: Diagnosis not present

## 2023-07-01 DIAGNOSIS — W19XXXA Unspecified fall, initial encounter: Secondary | ICD-10-CM | POA: Insufficient documentation

## 2023-07-01 DIAGNOSIS — T148XXA Other injury of unspecified body region, initial encounter: Secondary | ICD-10-CM

## 2023-07-01 MED ORDER — IBUPROFEN 400 MG PO TABS
600.0000 mg | ORAL_TABLET | Freq: Once | ORAL | Status: AC
  Administered 2023-07-01: 600 mg via ORAL
  Filled 2023-07-01: qty 1

## 2023-07-01 MED ORDER — ACETAMINOPHEN 500 MG PO TABS
1000.0000 mg | ORAL_TABLET | Freq: Once | ORAL | Status: AC
  Administered 2023-07-01: 1000 mg via ORAL
  Filled 2023-07-01: qty 2

## 2023-07-01 NOTE — Discharge Instructions (Signed)
It was a pleasure taking care of you here in the emergency department  Your x-rays did not show evidence of broken bones.  Would recommend Tylenol, Motrin as needed for pain, ice, elevate your extremities  Return for any worsening symptoms

## 2023-07-01 NOTE — ED Triage Notes (Addendum)
Pt states he was wearing rubber slides and fell on pavement injuring her left hand, rt. forearm and rt. Hand, left ankle and rt. knee -LOC

## 2023-07-01 NOTE — ED Provider Notes (Signed)
[facility] Provider Note   CSN: [id number] Arrival date & time: 07/01/23  1829    History  Chief Complaint  Patient presents with   Monica Dean is a 67 y.o. female here for evaluation mechanical fall.  Tripped and fell earlier today.  Denies any head, LOC or anticoagulation.  Pain to bilateral hands, forearm, knees.  Last tetanus less than 69 years old.  Ambulatory PTA.  No nausea, vomiting, numbness or weakness.  HPI     Home Medications Prior to Admission medications   Medication Sig Start Date End Date Taking? Authorizing Provider  ARMOUR THYROID 15 MG tablet Take 30 mg by mouth every morning. 03/05/18   [provider]  ELDERBERRY PO Take by mouth.    [provider]  fexofenadine (ALLEGRA) 180 MG tablet Take 180 mg by mouth daily. 1/2 (total 90 mg) tab daily for itchy    [provider]  FREESTYLE LITE test strip CHECK BLOOD SUGAR ONCE DAILY 01/18/18   [provider]  OVER THE COUNTER MEDICATION Essential Oil.    [provider]  saccharomyces boulardii (FLORASTOR) 250 MG capsule Take by mouth.    [provider]      Allergies    Codeine, Diazepam, Eszopiclone, Morphine, Other, and Oxycodone hcl    Review of Systems   Review of Systems  Constitutional: Negative.   HENT: Negative.    Respiratory: Negative.    Cardiovascular: Negative.   Gastrointestinal: Negative.   Genitourinary: Negative.   Skin:  Positive for wound.  Neurological: Negative.   All other systems reviewed and are negative.   Physical Exam Updated Vital Signs BP (!) 141/87 (BP Location: Right Arm)   Pulse 66   Temp 97.6 F (36.4 C) (Oral)   Resp 16   Ht 5\' 5"  (1.651 m)   Wt 73.5 kg   SpO2 100%   BMI 26.96 kg/m  Physical Exam Vitals and nursing note reviewed.  Constitutional:      General: She is not in acute distress.    Appearance: She is well-developed. She is not  ill-appearing, toxic-appearing or diaphoretic.  HENT:     Head: Normocephalic and atraumatic.     Nose: Nose normal.     Mouth/Throat:     Mouth: Mucous membranes are moist.  Eyes:     Pupils: Pupils are equal, round, and reactive to light.  Cardiovascular:     Rate and Rhythm: Normal rate.     Pulses: Normal pulses.          Radial pulses are 2+ on the right side and 2+ on the left side.       Dorsalis pedis pulses are 2+ on the right side and 2+ on the left side.     Heart sounds: Normal heart sounds.  Pulmonary:     Effort: Pulmonary effort is normal. No respiratory distress.     Breath sounds: Normal breath sounds.  Abdominal:     General: Bowel sounds are normal. There is no distension.     Palpations: Abdomen is soft.     Tenderness: There is no abdominal tenderness. There is no right CVA tenderness, left CVA tenderness, guarding or rebound.  Musculoskeletal:        General: Normal range of motion.     Cervical back: Normal range of motion.     Comments: No midline C/T/L tenderness.  There is bilateral hands with abrasions to bilateral  palmar aspect hands.  No scaphoid tenderness bilaterally.  Diffuse tenderness bilaterally, lifts arms overhead bilateral without difficulty.  Pelvis stable, nontender palpation.  Nontender bilateral femur, tib-fib.  Skin:    General: Skin is warm and dry.     Capillary Refill: Capillary refill takes less than 2 seconds.     Comments: Abrasions palmar aspect bilateral hands. Scuffed up toe nails bil without subungual hematoma  Neurological:     General: No focal deficit present.     Mental Status: She is alert and oriented to person, place, and time.     Cranial Nerves: No cranial nerve deficit.     Sensory: No sensory deficit.     Motor: No weakness.     Gait: Gait normal.  Psychiatric:        Mood and Affect: Mood normal.    ED Results / Procedures / Treatments   Labs (all labs ordered are listed, but only abnormal results are  displayed) Labs Reviewed - No data to display  EKG None  Radiology DG Hand Complete Left  Result Date: 07/01/2023 CLINICAL DATA:  Fall EXAM: LEFT HAND - COMPLETE 3+ VIEW COMPARISON:  11/10/2015 FINDINGS: There is no evidence of fracture or dislocation. There is no evidence of arthropathy or other focal bone abnormality. Soft tissues are unremarkable. IMPRESSION: Negative. Electronically Signed   By: Jasmine Pang M.D.   On: 07/01/2023 20:40   DG Ankle Complete Left  Result Date: 07/01/2023 CLINICAL DATA:  Fall EXAM: LEFT ANKLE COMPLETE - 3+ VIEW COMPARISON:  None Available. FINDINGS: No fracture or malalignment. Ossicle or remote injury adjacent to the medial malleolar tip. Ankle mortise is symmetric. Small plantar calcaneal spur IMPRESSION: No acute osseous abnormality. Electronically Signed   By: Jasmine Pang M.D.   On: 07/01/2023 20:39   DG Forearm Right  Result Date: 07/01/2023 CLINICAL DATA:  Fall EXAM: RIGHT FOREARM - 2 VIEW COMPARISON:  None Available. FINDINGS: There is no evidence of fracture or other focal bone lesions. Soft tissues are unremarkable. IMPRESSION: Negative. Electronically Signed   By: Jasmine Pang M.D.   On: 07/01/2023 20:38   DG Hand Complete Right  Result Date: 07/01/2023 CLINICAL DATA:  Fall EXAM: RIGHT HAND - COMPLETE 3+ VIEW COMPARISON:  None Available. FINDINGS: There is no evidence of fracture or dislocation. There is no evidence of arthropathy or other focal bone abnormality. Soft tissues are unremarkable. IMPRESSION: Negative. Electronically Signed   By: Jasmine Pang M.D.   On: 07/01/2023 20:38   DG Knee Complete 4 Views Right  Result Date: 07/01/2023 CLINICAL DATA:  Fall EXAM: RIGHT KNEE - COMPLETE 4+ VIEW COMPARISON:  None Available. FINDINGS: No fracture or malalignment. Moderate tricompartment arthritis of the knee worst involving the patellofemoral joint space. No sizable effusion. Joint space calcifications IMPRESSION: 1. No acute osseous  abnormality. 2. Moderate tricompartment arthritis. 3. Chondrocalcinosis. Electronically Signed   By: Jasmine Pang M.D.   On: 07/01/2023 20:37    Procedures Procedures    Medications Ordered in ED Medications  ibuprofen (ADVIL) tablet 600 mg (600 mg Oral Given 07/01/23 1927)  acetaminophen (TYLENOL) tablet 1,000 mg (1,000 mg Oral Given 07/01/23 1927)    ED Course/ Medical Decision Making/ A&P   67 year old here for evaluation mechanical fall.  Has some diffuse pain.  Some abrasions to bilateral palmar aspect hands and anterior knees.  She is ambulatory PTA.  Has a nonfocal neuroexam without deficits.  Denies any head, LOC or anticoagulation.  Tetanus is up-to-date.  Plan  on imaging and reassess  Imaging ordered from triage which I personally viewed and interpreted:  No acute fracture, dislocation  She does have some left great toe pain.  Her left foot was not x-rayed.  I offered x-ray here to rule out fracture, dislocation.  Patient states she would like to be discharged home.  She states she is ambulatory and does not need additional imaging.  Have her follow-up outpatient, return for new or worsening symptoms.  The patient has been appropriately medically screened and/or stabilized in the ED. I have low suspicion for any other emergent medical condition which would require further screening, evaluation or treatment in the ED or require inpatient management.  Patient is hemodynamically stable and in no acute distress.  Patient able to ambulate in department prior to ED.  Evaluation does not show acute pathology that would require ongoing or additional emergent interventions while in the emergency department or further inpatient treatment.  I have discussed the diagnosis with the patient and answered all questions.  Pain is been managed while in the emergency department and patient has no further complaints prior to discharge.  Patient is comfortable with plan discussed in room and is stable  for discharge at this time.  I have discussed strict return precautions for returning to the emergency department.  Patient was encouraged to follow-up with PCP/specialist refer to at discharge.                                 Medical Decision Making Amount and/or Complexity of Data Reviewed Independent Historian: spouse External Data Reviewed: labs, radiology and notes. Radiology: ordered and independent interpretation performed. Decision-making details documented in ED Course.  Risk OTC drugs. Prescription drug management. Decision regarding hospitalization. Diagnosis or treatment significantly limited by social determinants of health.          Final Clinical Impression(s) / ED Diagnoses Final diagnoses:  Fall, initial encounter  Abrasion    Rx / DC Orders ED Discharge Orders     None         Disney Ruggiero A, PA-C 07/01/23 2245    Loetta Rough, MD 07/03/23 1359
